# Patient Record
Sex: Female | Born: 1965 | Race: Black or African American | Hispanic: No | Marital: Married | Smoking: Never smoker
Health system: Southern US, Community
[De-identification: ages and names within clinical notes are randomized; demographics above are authoritative.]

## PROBLEM LIST (undated history)

## (undated) DIAGNOSIS — J302 Other seasonal allergic rhinitis: Secondary | ICD-10-CM

## (undated) DIAGNOSIS — T7840XA Allergy, unspecified, initial encounter: Secondary | ICD-10-CM

## (undated) DIAGNOSIS — D649 Anemia, unspecified: Secondary | ICD-10-CM

## (undated) DIAGNOSIS — Z87898 Personal history of other specified conditions: Secondary | ICD-10-CM

## (undated) HISTORY — DX: Personal history of other specified conditions: Z87.898

## (undated) HISTORY — DX: Allergy, unspecified, initial encounter: T78.40XA

---

## 1998-12-31 ENCOUNTER — Other Ambulatory Visit: Admission: RE | Admit: 1998-12-31 | Discharge: 1998-12-31 | Payer: Self-pay | Admitting: Gynecology

## 1999-12-21 ENCOUNTER — Other Ambulatory Visit: Admission: RE | Admit: 1999-12-21 | Discharge: 1999-12-21 | Payer: Self-pay | Admitting: Gynecology

## 2000-12-21 ENCOUNTER — Other Ambulatory Visit: Admission: RE | Admit: 2000-12-21 | Discharge: 2000-12-21 | Payer: Self-pay | Admitting: Gynecology

## 2001-12-24 ENCOUNTER — Other Ambulatory Visit: Admission: RE | Admit: 2001-12-24 | Discharge: 2001-12-24 | Payer: Self-pay | Admitting: Gynecology

## 2003-02-25 ENCOUNTER — Other Ambulatory Visit: Admission: RE | Admit: 2003-02-25 | Discharge: 2003-02-25 | Payer: Self-pay | Admitting: Gynecology

## 2004-03-22 ENCOUNTER — Other Ambulatory Visit: Admission: RE | Admit: 2004-03-22 | Discharge: 2004-03-22 | Payer: Self-pay | Admitting: Gynecology

## 2005-03-28 ENCOUNTER — Other Ambulatory Visit: Admission: RE | Admit: 2005-03-28 | Discharge: 2005-03-28 | Payer: Self-pay | Admitting: Gynecology

## 2008-01-10 ENCOUNTER — Encounter: Admission: RE | Admit: 2008-01-10 | Discharge: 2008-01-10 | Payer: Self-pay | Admitting: Endocrinology

## 2008-01-10 ENCOUNTER — Other Ambulatory Visit: Admission: RE | Admit: 2008-01-10 | Discharge: 2008-01-10 | Payer: Self-pay | Admitting: Diagnostic Radiology

## 2008-01-10 ENCOUNTER — Encounter (INDEPENDENT_AMBULATORY_CARE_PROVIDER_SITE_OTHER): Payer: Self-pay | Admitting: Diagnostic Radiology

## 2011-01-19 ENCOUNTER — Other Ambulatory Visit: Payer: Self-pay | Admitting: Gynecology

## 2011-01-19 DIAGNOSIS — R928 Other abnormal and inconclusive findings on diagnostic imaging of breast: Secondary | ICD-10-CM

## 2011-01-21 ENCOUNTER — Ambulatory Visit
Admission: RE | Admit: 2011-01-21 | Discharge: 2011-01-21 | Disposition: A | Discharge status: Home / Self Care | Payer: BC Managed Care – PPO | Source: Ambulatory Visit | Attending: Gynecology | Admitting: Gynecology

## 2011-01-21 ENCOUNTER — Other Ambulatory Visit: Payer: Self-pay | Admitting: Gynecology

## 2011-01-21 DIAGNOSIS — R928 Other abnormal and inconclusive findings on diagnostic imaging of breast: Secondary | ICD-10-CM

## 2011-04-06 ENCOUNTER — Encounter: Payer: BC Managed Care – PPO | Admitting: Oncology

## 2011-04-12 ENCOUNTER — Encounter (HOSPITAL_BASED_OUTPATIENT_CLINIC_OR_DEPARTMENT_OTHER): Payer: BC Managed Care – PPO | Admitting: Oncology

## 2011-04-12 ENCOUNTER — Other Ambulatory Visit: Payer: Self-pay | Admitting: Oncology

## 2011-04-12 DIAGNOSIS — D473 Essential (hemorrhagic) thrombocythemia: Secondary | ICD-10-CM

## 2011-04-12 DIAGNOSIS — D696 Thrombocytopenia, unspecified: Secondary | ICD-10-CM

## 2011-04-12 LAB — COMPREHENSIVE METABOLIC PANEL
ALT: 9 U/L (ref 0–35)
AST: 14 U/L (ref 0–37)
Albumin: 4.1 g/dL (ref 3.5–5.2)
Alkaline Phosphatase: 53 U/L (ref 39–117)
Calcium: 9.5 mg/dL (ref 8.4–10.5)
Chloride: 103 mEq/L (ref 96–112)
Potassium: 3.9 mEq/L (ref 3.5–5.3)
Sodium: 139 mEq/L (ref 135–145)
Total Protein: 6.9 g/dL (ref 6.0–8.3)

## 2011-04-12 LAB — MORPHOLOGY: PLT EST: INCREASED

## 2011-04-12 LAB — CBC WITH DIFFERENTIAL/PLATELET
BASO%: 0.5 % (ref 0.0–2.0)
Basophils Absolute: 0 10*3/uL (ref 0.0–0.1)
HCT: 33.9 % — ABNORMAL LOW (ref 34.8–46.6)
HGB: 11.1 g/dL — ABNORMAL LOW (ref 11.6–15.9)
LYMPH%: 18.4 % (ref 14.0–49.7)
MCH: 29.9 pg (ref 25.1–34.0)
MCHC: 32.6 g/dL (ref 31.5–36.0)
MONO#: 0.5 10*3/uL (ref 0.1–0.9)
NEUT%: 71.4 % (ref 38.4–76.8)
Platelets: 456 10*3/uL — ABNORMAL HIGH (ref 145–400)
WBC: 5.4 10*3/uL (ref 3.9–10.3)

## 2011-04-12 LAB — LACTATE DEHYDROGENASE: LDH: 138 U/L (ref 94–250)

## 2011-04-12 LAB — FERRITIN: Ferritin: 32 ng/mL (ref 10–291)

## 2011-04-12 LAB — SEDIMENTATION RATE: Sed Rate: 25 mm/hr — ABNORMAL HIGH (ref 0–22)

## 2011-04-12 LAB — C-REACTIVE PROTEIN: CRP: 0.7 mg/dL — ABNORMAL HIGH (ref <0.6)

## 2011-04-12 LAB — CANCER ANTIGEN 27.29: CA 27.29: 5 U/mL (ref 0–39)

## 2011-07-27 ENCOUNTER — Encounter (HOSPITAL_BASED_OUTPATIENT_CLINIC_OR_DEPARTMENT_OTHER): Payer: BC Managed Care – PPO | Admitting: Oncology

## 2011-08-02 ENCOUNTER — Other Ambulatory Visit: Payer: Self-pay | Admitting: Oncology

## 2011-08-02 ENCOUNTER — Encounter (HOSPITAL_BASED_OUTPATIENT_CLINIC_OR_DEPARTMENT_OTHER): Payer: BC Managed Care – PPO | Admitting: Oncology

## 2011-08-02 DIAGNOSIS — D473 Essential (hemorrhagic) thrombocythemia: Secondary | ICD-10-CM

## 2011-08-02 LAB — CBC & DIFF AND RETIC
Basophils Absolute: 0 10*3/uL (ref 0.0–0.1)
EOS%: 1.2 % (ref 0.0–7.0)
HGB: 11.8 g/dL (ref 11.6–15.9)
MCH: 28.9 pg (ref 25.1–34.0)
MCV: 90 fL (ref 79.5–101.0)
MONO%: 10.9 % (ref 0.0–14.0)
RDW: 13.4 % (ref 11.2–14.5)
Retic Ct Abs: 87.12 10*3/uL (ref 33.70–90.70)

## 2011-08-02 LAB — MORPHOLOGY: PLT EST: INCREASED

## 2011-11-23 ENCOUNTER — Other Ambulatory Visit: Payer: Self-pay | Admitting: Obstetrics and Gynecology

## 2011-11-25 ENCOUNTER — Encounter (HOSPITAL_COMMUNITY): Payer: Self-pay | Admitting: Pharmacist

## 2011-11-29 ENCOUNTER — Encounter (HOSPITAL_COMMUNITY)
Admission: RE | Admit: 2011-11-29 | Discharge: 2011-11-29 | Disposition: A | Discharge status: Home / Self Care | Payer: BC Managed Care – PPO | Source: Ambulatory Visit | Attending: Obstetrics and Gynecology | Admitting: Obstetrics and Gynecology

## 2011-11-29 ENCOUNTER — Encounter (HOSPITAL_COMMUNITY): Payer: Self-pay

## 2011-11-29 HISTORY — DX: Anemia, unspecified: D64.9

## 2011-11-29 HISTORY — DX: Other seasonal allergic rhinitis: J30.2

## 2011-11-29 LAB — CBC
HCT: 37.3 % (ref 36.0–46.0)
Hemoglobin: 11.8 g/dL — ABNORMAL LOW (ref 12.0–15.0)
MCHC: 31.6 g/dL (ref 30.0–36.0)
MCV: 92.1 fL (ref 78.0–100.0)
RDW: 13.8 % (ref 11.5–15.5)
WBC: 6.4 10*3/uL (ref 4.0–10.5)

## 2011-11-29 NOTE — Patient Instructions (Addendum)
   Your procedure is scheduled [id number] Feb 26th  Enter through the Main Entrance of [facility] at:10:30am Pick up the phone at the desk and dial [phone] and inform us of your arrival.  Please call this number if you have any problems the morning of surgery: [phone]  Remember: Do not eat food after midnight: Monday Do not drink clear liquids after:Tuesday at 9am Take these medicines the morning of surgery with a SIP OF WATER:none Do not wear jewelry, make-up, or FINGER nail polish Do not wear lotions, powders, perfumes or deodorant. Do not shave 48 hours prior to surgery. Do not bring valuables to the hospital.  Leave suitcase in the car. After Surgery it may be brought to your room. For patients being admitted to the hospital, checkout time is 11:00am the day of discharge.  Patients discharged on the day of surgery will not be allowed to drive home.     Remember to use your hibiclens as instructed.Please shower with 1/2 bottle the evening before your surgery and the other 1/2 bottle the morning of surgery.

## 2011-12-06 ENCOUNTER — Encounter (HOSPITAL_COMMUNITY)
Admission: RE | Disposition: A | Discharge status: Home Health | Payer: Self-pay | Source: Ambulatory Visit | Attending: Obstetrics and Gynecology

## 2011-12-06 ENCOUNTER — Encounter (HOSPITAL_COMMUNITY): Payer: Self-pay | Admitting: Anesthesiology

## 2011-12-06 ENCOUNTER — Ambulatory Visit (HOSPITAL_COMMUNITY)
Admission: RE | Admit: 2011-12-06 | Discharge: 2011-12-06 | Disposition: A | Discharge status: Home Health | Payer: BC Managed Care – PPO | Source: Ambulatory Visit | Attending: Obstetrics and Gynecology | Admitting: Obstetrics and Gynecology

## 2011-12-06 ENCOUNTER — Ambulatory Visit (HOSPITAL_COMMUNITY): Payer: BC Managed Care – PPO | Admitting: Anesthesiology

## 2011-12-06 DIAGNOSIS — D279 Benign neoplasm of unspecified ovary: Secondary | ICD-10-CM | POA: Insufficient documentation

## 2011-12-06 DIAGNOSIS — D369 Benign neoplasm, unspecified site: Secondary | ICD-10-CM

## 2011-12-06 DIAGNOSIS — N803 Endometriosis of pelvic peritoneum, unspecified: Secondary | ICD-10-CM | POA: Insufficient documentation

## 2011-12-06 DIAGNOSIS — N831 Corpus luteum cyst of ovary, unspecified side: Secondary | ICD-10-CM | POA: Insufficient documentation

## 2011-12-06 DIAGNOSIS — Z01818 Encounter for other preprocedural examination: Secondary | ICD-10-CM | POA: Insufficient documentation

## 2011-12-06 DIAGNOSIS — Z01812 Encounter for preprocedural laboratory examination: Secondary | ICD-10-CM | POA: Insufficient documentation

## 2011-12-06 HISTORY — PX: LAPAROSCOPY: SHX197

## 2011-12-06 HISTORY — PX: OVARIAN CYST REMOVAL: SHX89

## 2011-12-06 SURGERY — LAPAROSCOPY OPERATIVE
Anesthesia: General | Site: Abdomen | Wound class: Clean Contaminated

## 2011-12-06 MED ORDER — DEXAMETHASONE SODIUM PHOSPHATE 4 MG/ML IJ SOLN
INTRAMUSCULAR | Status: DC | PRN
  Administered 2011-12-06: 10 mg via INTRAVENOUS

## 2011-12-06 MED ORDER — NEOSTIGMINE METHYLSULFATE 1 MG/ML IJ SOLN
INTRAMUSCULAR | Status: DC | PRN
  Administered 2011-12-06: 3 mg via INTRAVENOUS

## 2011-12-06 MED ORDER — CEFAZOLIN SODIUM 1-5 GM-% IV SOLN
INTRAVENOUS | Status: AC
  Filled 2011-12-06: qty 50

## 2011-12-06 MED ORDER — PROMETHAZINE HCL 25 MG RE SUPP
25.0000 mg | RECTAL | Status: DC | PRN
  Administered 2011-12-06: 25 mg via RECTAL

## 2011-12-06 MED ORDER — METOCLOPRAMIDE HCL 5 MG/ML IJ SOLN
INTRAMUSCULAR | Status: AC
  Administered 2011-12-06: 10 mg via INTRAVENOUS
  Filled 2011-12-06: qty 2

## 2011-12-06 MED ORDER — LACTATED RINGERS IV SOLN
INTRAVENOUS | Status: DC
  Administered 2011-12-06: 11:00:00 via INTRAVENOUS

## 2011-12-06 MED ORDER — FENTANYL CITRATE 0.05 MG/ML IJ SOLN
INTRAMUSCULAR | Status: AC
  Administered 2011-12-06: 50 ug via INTRAVENOUS
  Filled 2011-12-06: qty 2

## 2011-12-06 MED ORDER — BUPIVACAINE HCL (PF) 0.25 % IJ SOLN
INTRAMUSCULAR | Status: DC | PRN
  Administered 2011-12-06: 5 mL

## 2011-12-06 MED ORDER — PROPOFOL 10 MG/ML IV EMUL
INTRAVENOUS | Status: DC | PRN
  Administered 2011-12-06: 180 mg via INTRAVENOUS

## 2011-12-06 MED ORDER — LACTATED RINGERS IR SOLN
Status: DC | PRN
  Administered 2011-12-06: 3000 mL

## 2011-12-06 MED ORDER — METOCLOPRAMIDE HCL 5 MG/ML IJ SOLN
10.0000 mg | INTRAMUSCULAR | Status: AC
  Administered 2011-12-06: 10 mg via INTRAVENOUS

## 2011-12-06 MED ORDER — FENTANYL CITRATE 0.05 MG/ML IJ SOLN
INTRAMUSCULAR | Status: DC | PRN
  Administered 2011-12-06 (×2): 50 ug via INTRAVENOUS
  Administered 2011-12-06: 150 ug via INTRAVENOUS

## 2011-12-06 MED ORDER — BUPIVACAINE HCL (PF) 0.25 % IJ SOLN
INTRAMUSCULAR | Status: AC
  Filled 2011-12-06: qty 30

## 2011-12-06 MED ORDER — ONDANSETRON HCL 4 MG/2ML IJ SOLN
INTRAMUSCULAR | Status: DC | PRN
  Administered 2011-12-06: 4 mg via INTRAVENOUS

## 2011-12-06 MED ORDER — GLYCOPYRROLATE 0.2 MG/ML IJ SOLN
INTRAMUSCULAR | Status: DC | PRN
  Administered 2011-12-06: .4 mg via INTRAVENOUS

## 2011-12-06 MED ORDER — ROCURONIUM BROMIDE 100 MG/10ML IV SOLN
INTRAVENOUS | Status: DC | PRN
  Administered 2011-12-06: 40 mg via INTRAVENOUS

## 2011-12-06 MED ORDER — INDIGOTINDISULFONATE SODIUM 8 MG/ML IJ SOLN
INTRAMUSCULAR | Status: AC
  Filled 2011-12-06: qty 5

## 2011-12-06 MED ORDER — FENTANYL CITRATE 0.05 MG/ML IJ SOLN
25.0000 ug | INTRAMUSCULAR | Status: DC | PRN
  Administered 2011-12-06: 50 ug via INTRAVENOUS

## 2011-12-06 MED ORDER — FENTANYL CITRATE 0.05 MG/ML IJ SOLN
INTRAMUSCULAR | Status: AC
  Filled 2011-12-06: qty 5

## 2011-12-06 MED ORDER — MIDAZOLAM HCL 5 MG/5ML IJ SOLN
INTRAMUSCULAR | Status: DC | PRN
  Administered 2011-12-06: 2 mg via INTRAVENOUS

## 2011-12-06 MED ORDER — KETOROLAC TROMETHAMINE 30 MG/ML IJ SOLN
INTRAMUSCULAR | Status: DC | PRN
  Administered 2011-12-06: 30 mg via INTRAVENOUS

## 2011-12-06 MED ORDER — PROMETHAZINE HCL 25 MG RE SUPP
RECTAL | Status: AC
  Administered 2011-12-06: 25 mg via RECTAL
  Filled 2011-12-06: qty 1

## 2011-12-06 MED ORDER — VASOPRESSIN 20 UNIT/ML IJ SOLN
INTRAMUSCULAR | Status: AC
  Filled 2011-12-06: qty 1

## 2011-12-06 MED ORDER — CEFAZOLIN SODIUM 1-5 GM-% IV SOLN
1.0000 g | INTRAVENOUS | Status: AC
  Administered 2011-12-06: 1 g via INTRAVENOUS

## 2011-12-06 MED ORDER — MIDAZOLAM HCL 2 MG/2ML IJ SOLN
INTRAMUSCULAR | Status: AC
  Filled 2011-12-06: qty 2

## 2011-12-06 MED ORDER — LIDOCAINE HCL (CARDIAC) 20 MG/ML IV SOLN
INTRAVENOUS | Status: DC | PRN
  Administered 2011-12-06: 50 mg via INTRAVENOUS

## 2011-12-06 MED ORDER — OXYCODONE-ACETAMINOPHEN 5-325 MG PO TABS: 1.0000 | ORAL_TABLET | ORAL | Status: AC | PRN

## 2011-12-06 SURGICAL SUPPLY — 32 items
ADH SKN CLS APL DERMABOND .7 (GAUZE/BANDAGES/DRESSINGS) ×2
ADH SKN CLS LQ APL DERMABOND (GAUZE/BANDAGES/DRESSINGS) ×2
BAG SPEC RTRVL LRG 6X4 10 (ENDOMECHANICALS) ×4
CABLE HIGH FREQUENCY MONO STRZ (ELECTRODE) ×1 IMPLANT
CATH ROBINSON RED A/P 16FR (CATHETERS) ×3 IMPLANT
CLOTH BEACON ORANGE TIMEOUT ST (SAFETY) ×3 IMPLANT
DERMABOND ADHESIVE PROPEN (GAUZE/BANDAGES/DRESSINGS) ×1
DERMABOND ADVANCED (GAUZE/BANDAGES/DRESSINGS) ×1
DERMABOND ADVANCED .7 DNX12 (GAUZE/BANDAGES/DRESSINGS) ×2 IMPLANT
DERMABOND ADVANCED .7 DNX6 (GAUZE/BANDAGES/DRESSINGS) IMPLANT
EVACUATOR SMOKE 8.L (FILTER) IMPLANT
FORCEPS CUTTING 33CM 5MM (CUTTING FORCEPS) IMPLANT
GLOVE ECLIPSE 7.0 STRL STRAW (GLOVE) ×6 IMPLANT
GOWN PREVENTION PLUS LG XLONG (DISPOSABLE) ×3 IMPLANT
GOWN PREVENTION PLUS XLARGE (GOWN DISPOSABLE) ×3 IMPLANT
NS IRRIG 1000ML POUR BTL (IV SOLUTION) ×3 IMPLANT
PACK LAPAROSCOPY BASIN (CUSTOM PROCEDURE TRAY) ×3 IMPLANT
POUCH SPECIMEN RETRIEVAL 10MM (ENDOMECHANICALS) ×2 IMPLANT
PROTECTOR NERVE ULNAR (MISCELLANEOUS) ×3 IMPLANT
SCISSORS LAP 5X35 DISP (ENDOMECHANICALS) ×1 IMPLANT
SEALER TISSUE G2 CVD JAW 45CM (ENDOMECHANICALS) IMPLANT
SET IRRIG TUBING LAPAROSCOPIC (IRRIGATION / IRRIGATOR) ×1 IMPLANT
SLEEVE Z-THREAD 5X100MM (TROCAR) ×1 IMPLANT
SUT VICRYL 0 UR6 27IN ABS (SUTURE) ×1 IMPLANT
SUT VICRYL RAPIDE 3 0 (SUTURE) ×3 IMPLANT
SYR 30ML LL (SYRINGE) ×1 IMPLANT
TOWEL OR 17X24 6PK STRL BLUE (TOWEL DISPOSABLE) ×6 IMPLANT
TROCAR BALLN 12MMX100 BLUNT (TROCAR) ×3 IMPLANT
TROCAR Z-THREAD BLADED 5X100MM (TROCAR) ×5 IMPLANT
TROCAR Z-THREAD FIOS 5X100MM (TROCAR) ×1 IMPLANT
WARMER LAPAROSCOPE (MISCELLANEOUS) ×3 IMPLANT
WATER STERILE IRR 1000ML POUR (IV SOLUTION) ×3 IMPLANT

## 2011-12-06 NOTE — Discharge Instructions (Signed)

## 2011-12-06 NOTE — Transfer of Care (Signed)
Immediate Anesthesia Transfer of Care Note  Patient: [name]  Procedure(s) Performed: Procedure(s) (LRB): LAPAROSCOPY OPERATIVE ([name]) OVARIAN CYSTECTOMY (Bilateral)  Patient Location: PACU  Anesthesia Type: MAC  Level of Consciousness: awake, alert  and oriented  Airway & Oxygen Therapy: Patient Spontanous Breathing  Post-op Assessment: Report given to PACU RN and Post -op Vital signs reviewed and stable  Post vital signs: Reviewed and stable  Complications: No apparent anesthesia complications

## 2011-12-06 NOTE — Transfer of Care (Deleted)
Immediate Anesthesia Transfer of Care Note  Patient: [name]  Procedure(s) Performed: Procedure(s) (LRB): LAPAROSCOPY OPERATIVE ([name]) OVARIAN CYSTECTOMY (Bilateral)  Patient Location: PACU  Anesthesia Type: General  Level of Consciousness: awake, alert  and oriented  Airway & Oxygen Therapy: Patient Spontanous Breathing and Patient connected to nasal cannula oxygen  Post-op Assessment: Report given to PACU RN and Post -op Vital signs reviewed and stable  Post vital signs: Reviewed and stable  Complications: No apparent anesthesia complications

## 2011-12-06 NOTE — Anesthesia Procedure Notes (Signed)
Procedure Name: Intubation Date/Time: 12/06/2011 12:06 PM Performed by: Madison Hickman Pre-anesthesia Checklist: Patient being monitored, Suction available, Emergency Drugs available, Patient identified and Timeout performed Patient Re-evaluated:Patient Re-evaluated prior to inductionOxygen Delivery Method: Circle system utilized Preoxygenation: Pre-oxygenation with 100% oxygen Intubation Type: IV induction Ventilation: Mask ventilation without difficulty Grade View: Grade III Tube type: Oral Tube size: 7.0 mm Number of attempts: 2 Airway Equipment and Method: Stylet and Video-laryngoscopy Placement Confirmation: ETT inserted through vocal cords under direct vision,  positive ETCO2,  CO2 detector and breath sounds checked- equal and bilateral Secured at: 21 cm Tube secured with: Tape Dental Injury: Teeth and Oropharynx as per pre-operative assessment  Difficulty Due To: Difficult Airway- due to anterior larynx

## 2011-12-06 NOTE — Anesthesia Postprocedure Evaluation (Signed)
Anesthesia Post Note  Patient: [name]  Procedure(s) Performed: Procedure(s) (LRB): LAPAROSCOPY OPERATIVE ([name]) OVARIAN CYSTECTOMY (Bilateral)  Anesthesia type: General  Patient location: PACU  Post pain: Pain level controlled  Post assessment: Post-op Vital signs reviewed  Last Vitals:  Filed Vitals:   12/06/11 1500  BP: 116/71  Pulse: 95  Temp:   Resp: 16    Post vital signs: Reviewed  Level of consciousness: sedated  Complications: No apparent anesthesia complications

## 2011-12-06 NOTE — Anesthesia Preprocedure Evaluation (Signed)
Anesthesia Evaluation  Patient identified by MRN, date of birth, ID band Patient awake    Reviewed: Allergy & Precautions, H&P , Patient's Chart, lab work & pertinent test results, reviewed documented beta blocker date and time   Airway Mallampati: II TM Distance: >3 FB Neck ROM: full    Dental No notable dental hx.    Pulmonary  clear to auscultation  Pulmonary exam normal       Cardiovascular regular Normal    Neuro/Psych    GI/Hepatic   Endo/Other    Renal/GU      Musculoskeletal   Abdominal   Peds  Hematology   Anesthesia Other Findings   Reproductive/Obstetrics                           Anesthesia Physical Anesthesia Plan  ASA: II  Anesthesia Plan: General   Post-op Pain Management:    Induction: Intravenous  Airway Management Planned: Oral ETT  Additional Equipment:   Intra-op Plan:   Post-operative Plan:   Informed Consent: I have reviewed the patients History and Physical, chart, labs and discussed the procedure including the risks, benefits and alternatives for the proposed anesthesia with the patient or authorized representative who has indicated his/her understanding and acceptance.   Dental Advisory Given and Dental advisory given  Plan Discussed with: CRNA and Surgeon  Anesthesia Plan Comments: (  Discussed  general anesthesia, including possible nausea, instrumentation of airway, sore throat,pulmonary aspiration, etc. I asked if the were any outstanding questions, or  concerns before we proceeded. )        Anesthesia Quick Evaluation  

## 2011-12-06 NOTE — H&P (Signed)
Pt is a 46 year old black female who presents to the OR for diagnostic scope and bilateral ovarian cystectomy. Pt was found to have a dermoid cyst on ultrasound. It measured 2.5cm. It does not cause significant pain but the patient wants it out. Pt also has a small simple cyst on the opposite ovary. PE : VSSAF          HEENT- wnl          Abd- soft, NABS, non tender.          Pelvic- wnl IMP/ Dermoid cyst PLAN/ diagnostic scope

## 2011-12-07 ENCOUNTER — Encounter (HOSPITAL_COMMUNITY): Payer: Self-pay | Admitting: Obstetrics and Gynecology

## 2011-12-07 NOTE — Op Note (Signed)
[name], [name] NO.:  [id number]  MEDICAL RECORD NO.:  [id number]  LOCATION:  [facility]                          FACILITY:  [facility]  PHYSICIAN:  Malva Limes, M.D.    DATE OF BIRTH:  May 20, 1966  DATE OF PROCEDURE:  12/06/2011 DATE OF DISCHARGE:  12/06/2011                              OPERATIVE REPORT   PREOPERATIVE DIAGNOSES: 1. Dermoid ovarian cyst. 2. Simple ovarian cyst.  POSTOPERATIVE DIAGNOSES: 1. Left dermoid cyst. 2. Right corpus luteum cyst. 3. Minimal endometriosis.  PRINCIPAL PROCEDURES: 1. Diagnostic laparoscopy. 2. Left ovarian cystectomy. 3. Right ovarian cystectomy. 4. Cauterization of endometriosis.  SURGEON:  Malva Limes, M.D.  ASSISTANT:  Diane Redden, MD  ANESTHESIA:  General endotracheal.  ANTIBIOTICS:  Ancef 1 g.  DRAINS:  Foley at bedside drainage.  ESTIMATED BLOOD LOSS:  Minimal.  SPECIMENS:  Left dermoid cyst sent to Pathology.  COMPLICATIONS:  None.  ESTIMATED BLOOD LOSS:  Minimal.  DESCRIPTION OF PROCEDURE:  The patient was taken to the operating room Where a general anesthetic was administered.  Once this was accomplished, she was placed in the dorsal lithotomy position.  She was prepped and draped in the usual fashion for this procedure.  Her bladder was drained with a red rubber catheter.  Hulka tenaculum was applied to the anterior cervical lip.  Her umbilicus was then injected with 0.25% Marcaine.  Vertical skin incision was made.  The fascia was grasped and entered with the Mayo scissors.  Parietal peritoneum was entered with blunt dissection.  A 0 Vicryl suture was placed in a pursestring fashion.  The Hasson cannula was placed in the abdominal cavity.   3 L of carbon dioxide was insufflated.  The patient was placed in Trendelenburg.  The patient had 5-mm ports placed in the right and left lower quadrants under direct visualization. Fallopian tubes appeared to be normal bilaterally.  The uterus  was without lesions or fibroids.  The patient had minimal amount of endometriosis on the left uterosacral ligament, and also the left posterior cul-de-sac.  The ovaries were mobile.  The larger cyst was on the left that had been previously reported on ultrasound as a simple cyst.  The serosa of the ovary was then cauterized and opened with the scissors.  During dissection of the cyst, thick mucoid fluid drained, consistent with a dermoid cyst.  Once the cyst was freed, it was placed in the posterior cul-de-sac.  It was obvious that this was a dermoid cyst. Attention was then directed to the right ovary.  Right ovary was grasped.  The serosa over the cyst cauterized and opened.  On opening this, it was obvious that the patient had corpus luteum cyst on this side.  Following this, cauterization of endometriosis on the left pelvic wall was accomplished. Following this, the 10-mm scope was removed from the umbilicus and Endobag placed through this port.  A 5-mm camera was placed through the right lower quadrant.  The dermoid cyst was then placed in the Endobag and removed through the 10-mm port.  Following this, the pelvis was copiously irrigated.  Hemostasis was checked and felt to be adequate. This completed the procedure.  Instruments were removed. Pneumoperitoneum  released.  The fascia was closed with 0 Vicryl suture. The skin with 3-0 Vicryl suture and Dermabond.  The patient was awoken and taken to the recovery room in stable condition.  Instrument, lap count was correct x2.          ______________________________ Malva Limes, M.D.     MA/MEDQ  D:  12/06/2011  T:  12/07/2011  Job:  [id number]

## 2012-02-13 ENCOUNTER — Other Ambulatory Visit: Payer: BC Managed Care – PPO | Admitting: Lab

## 2012-04-26 ENCOUNTER — Encounter (HOSPITAL_COMMUNITY): Payer: Self-pay | Admitting: *Deleted

## 2012-04-26 ENCOUNTER — Ambulatory Visit (INDEPENDENT_AMBULATORY_CARE_PROVIDER_SITE_OTHER): Payer: BC Managed Care – PPO | Admitting: Emergency Medicine

## 2012-04-26 ENCOUNTER — Inpatient Hospital Stay (HOSPITAL_COMMUNITY)
Admission: EM | Admit: 2012-04-26 | Discharge: 2012-04-27 | DRG: 301 | Disposition: A | Discharge status: Home / Self Care | Payer: BC Managed Care – PPO | Attending: Internal Medicine | Admitting: Internal Medicine

## 2012-04-26 VITALS — BP 108/70 | HR 108 | Temp 100.9°F | Resp 12 | Ht 64.25 in | Wt 161.8 lb

## 2012-04-26 DIAGNOSIS — L03221 Cellulitis of neck: Secondary | ICD-10-CM

## 2012-04-26 DIAGNOSIS — R000 Tachycardia, unspecified: Secondary | ICD-10-CM | POA: Diagnosis present

## 2012-04-26 DIAGNOSIS — R1319 Other dysphagia: Secondary | ICD-10-CM | POA: Diagnosis present

## 2012-04-26 DIAGNOSIS — D649 Anemia, unspecified: Secondary | ICD-10-CM | POA: Diagnosis present

## 2012-04-26 DIAGNOSIS — L0211 Cutaneous abscess of neck: Secondary | ICD-10-CM

## 2012-04-26 DIAGNOSIS — J309 Allergic rhinitis, unspecified: Secondary | ICD-10-CM | POA: Diagnosis present

## 2012-04-26 DIAGNOSIS — E041 Nontoxic single thyroid nodule: Principal | ICD-10-CM | POA: Diagnosis present

## 2012-04-26 DIAGNOSIS — R221 Localized swelling, mass and lump, neck: Secondary | ICD-10-CM

## 2012-04-26 LAB — COMPREHENSIVE METABOLIC PANEL
AST: 15 U/L (ref 0–37)
BUN: 8 mg/dL (ref 6–23)
CO2: 25 mEq/L (ref 19–32)
Calcium: 10 mg/dL (ref 8.4–10.5)
Chloride: 99 mEq/L (ref 96–112)
Creatinine, Ser: 0.66 mg/dL (ref 0.50–1.10)
GFR calc non Af Amer: >90 mL/min (ref >90)
Total Bilirubin: 0.3 mg/dL (ref 0.3–1.2)

## 2012-04-26 LAB — CBC WITH DIFFERENTIAL/PLATELET
Basophils Absolute: 0 10*3/uL (ref 0.0–0.1)
Basophils Relative: 0 % (ref 0–1)
Eosinophils Relative: 0 % (ref 0–5)
HCT: 35.1 % — ABNORMAL LOW (ref 36.0–46.0)
Hemoglobin: 11.6 g/dL — ABNORMAL LOW (ref 12.0–15.0)
MCHC: 33 g/dL (ref 30.0–36.0)
MCV: 89.5 fL (ref 78.0–100.0)
Monocytes Absolute: 1.1 10*3/uL — ABNORMAL HIGH (ref 0.1–1.0)
Monocytes Relative: 11 % (ref 3–12)
RDW: 13.5 % (ref 11.5–15.5)

## 2012-04-26 LAB — URINALYSIS, ROUTINE W REFLEX MICROSCOPIC
Bilirubin Urine: NEGATIVE
Glucose, UA: NEGATIVE mg/dL
Ketones, ur: 40 mg/dL — AB
Protein, ur: NEGATIVE mg/dL
Urobilinogen, UA: 0.2 mg/dL (ref 0.0–1.0)

## 2012-04-26 LAB — URINE MICROSCOPIC-ADD ON

## 2012-04-26 LAB — RAPID STREP SCREEN (MED CTR MEBANE ONLY): Streptococcus, Group A Screen (Direct): NEGATIVE

## 2012-04-26 MED ORDER — ONDANSETRON HCL 4 MG/2ML IJ SOLN: 4.0000 mg | Freq: Once | INTRAMUSCULAR | Status: DC

## 2012-04-26 MED ORDER — SODIUM CHLORIDE 0.9 % IV BOLUS (SEPSIS)
1000.0000 mL | Freq: Once | INTRAVENOUS | Status: AC
  Administered 2012-04-26: 1000 mL via INTRAVENOUS

## 2012-04-26 MED ORDER — CEFTRIAXONE SODIUM 1 G IJ SOLR
1.0000 g | Freq: Once | INTRAMUSCULAR | Status: AC
  Administered 2012-04-26: 1 g via INTRAMUSCULAR

## 2012-04-26 MED ORDER — ACETAMINOPHEN 325 MG PO TABS
650.0000 mg | ORAL_TABLET | Freq: Once | ORAL | Status: AC
  Administered 2012-04-26: 650 mg via ORAL
  Filled 2012-04-26: qty 2

## 2012-04-26 MED ORDER — FENTANYL CITRATE 0.05 MG/ML IJ SOLN: 50.0000 ug | Freq: Once | INTRAMUSCULAR | Status: DC

## 2012-04-26 NOTE — ED Notes (Addendum)
Pt c/o swelling in lymph nodes on left side of neck. Pt reports it has been very sore and when she was at urgent care they suggested she come over to get a CT scan here. Pt reports her neck was stiff on Tuesday and the swelling started Wednesday and has progressively gotten worse. Pt felt concerned because the swelling has not gone down. Pt has tried ice and ibuprofen at home. Pt denies injury and fall. Pt denies SOB. Pt reports it is painful to swallow but denies any feeling of obstruction.

## 2012-04-26 NOTE — ED Notes (Signed)
The pt has swelling in her lt neck with some sorethroat since yesterday.  She was seen at [place] and sent here  ?? For a c-t she has an earache also.  No strep screen done nut she was given amn antibiotic shot there

## 2012-04-26 NOTE — Progress Notes (Signed)
  Subjective:    Patient ID: [name], female    DOB: [birth year], 46 y.o.   MRN: [id number]  Otalgia  Associated symptoms include coughing and neck pain. Pertinent negatives include no abdominal pain, diarrhea, ear discharge, headaches, hearing loss, rhinorrhea or vomiting.  Sore Throat  This is a new problem. The current episode started today. The problem has been gradually worsening. The pain is worse on the left side. The maximum temperature recorded prior to her arrival was 101 - 101.9 F. The fever has been present for less than 1 day. The pain is moderate. Associated symptoms include coughing, ear pain, neck pain and trouble swallowing. Pertinent negatives include no abdominal pain, congestion, diarrhea, drooling, ear discharge, headaches, hoarse voice, plugged ear sensation, shortness of breath, stridor, swollen glands or vomiting. She has tried nothing for the symptoms.      Review of Systems  Constitutional: Positive for fever.  HENT: Positive for ear pain, trouble swallowing, neck pain and neck stiffness. Negative for hearing loss, nosebleeds, congestion, hoarse voice, rhinorrhea, sneezing, drooling, postnasal drip, tinnitus and ear discharge.   Eyes: Negative.  Negative for discharge.  Respiratory: Positive for cough. Negative for choking, chest tightness, shortness of breath, wheezing and stridor.   Cardiovascular: Negative.   Gastrointestinal: Negative.  Negative for vomiting, abdominal pain and diarrhea.  Genitourinary: Negative.   Neurological: Negative for headaches.       Objective:   Physical Exam  Constitutional: She is oriented to person, place, and time. She appears well-developed and well-nourished.  HENT:  Head: Normocephalic and atraumatic.  Right Ear: External ear normal.  Eyes: Conjunctivae are normal. Pupils are equal, round, and reactive to light.  Neck: Normal range of motion. No tracheal deviation present. Mass present.  Cardiovascular: Normal rate  and regular rhythm.   Pulmonary/Chest: Effort normal and breath sounds normal.  Abdominal: Soft. Bowel sounds are normal.  Musculoskeletal: Normal range of motion.  Lymphadenopathy:    She has cervical adenopathy.  Neurological: She is alert and oriented to person, place, and time.  Skin: Skin is warm and dry.          Assessment & Plan:  Atypical neck mass associated with dysphagia, fever, and stiff neck.  Pain with cough.   To ER after dose of rocephin for CT

## 2012-04-26 NOTE — ED Provider Notes (Signed)
History     CSN: [id number]  Arrival date & time 04/26/12  4098   First MD Initiated Contact with Patient 04/26/12 2317      Chief Complaint  Patient presents with  . sent for a c-t     (Consider location/radiation/quality/duration/timing/severity/associated sxs/prior treatment) HPI History per patient. Left-sided neck discomfort and swelling. About 4 days ago noticed stiffness anterior lateral left neck. No headache or nuchal rigidity. Symptoms progressively worsening and today went to urgent care. She was given a shot of antibiotics and sent here for a CAT scan to further evaluate increasing mass left neck. When she arrived here she was noted to have a fever. Patient denies any chills or fevers otherwise. No rashes. No sore throat. No difficulty breathing. No difficulty swallowing. No recent dental surgeries or surgeries otherwise. She denies any significant pain and declines any pain medications. Since getting Tylenol and triage is feeling somewhat better. She denies any medical problems including history of cancer. Moderate severity. No known aggravating or alleviating factors. History of same. Past Medical History  Diagnosis Date  . Anemia   . Seasonal allergies     Past Surgical History  Procedure Date  . No past surgeries   . Laparoscopy 12/06/2011    Procedure: LAPAROSCOPY OPERATIVE;  Surgeon: Levi Aland, MD;  Location: [facility];  Service: Gynecology;  Laterality: [name];  . Ovarian cyst removal 12/06/2011    Procedure: OVARIAN CYSTECTOMY;  Surgeon: Levi Aland, MD;  Location: [facility];  Service: Gynecology;  Laterality: Bilateral;    No family history on file.  History  Substance Use Topics  . Smoking status: Never Smoker   . Smokeless tobacco: Not on file  . Alcohol Use: No    OB History    Grav Para Term Preterm Abortions TAB SAB Ect Mult Living                  Review of Systems  Constitutional: Negative for fever and chills.  HENT: Positive for neck  stiffness. Negative for neck pain.   Eyes: Negative for pain.  Respiratory: Negative for shortness of breath.   Cardiovascular: Negative for chest pain, palpitations and leg swelling.  Gastrointestinal: Negative for abdominal pain.  Genitourinary: Negative for dysuria.  Musculoskeletal: Negative for back pain.  Skin: Negative for rash.  Neurological: Negative for headaches.  All other systems reviewed and are negative.    Allergies  Review of patient's allergies indicates no known allergies.  Home Medications   Current Outpatient Rx  Name Route Sig Dispense Refill  . ASPIRIN 81 MG PO TABS Oral Take 81 mg by mouth daily. Stopped taking 11/18/11    . FEXOFENADINE HCL 180 MG PO TABS Oral Take 180 mg by mouth daily as needed. For allergies      BP 119/75  Pulse 125  Temp 101.5 F (38.6 C) (Oral)  Resp 18  SpO2 99%  LMP 04/11/2012  Physical Exam  Constitutional: She is oriented to person, place, and time. She appears well-developed and well-nourished.  HENT:  Head: Normocephalic and atraumatic.  Mouth/Throat: Oropharynx is clear and moist. No oropharyngeal exudate.       Uvula midline without tonsillar exudates or swelling. No trismus.  Eyes: Conjunctivae and EOM are normal. Pupils are equal, round, and reactive to light.  Neck: Trachea normal. Neck supple. No tracheal deviation present.       Large Left anterior lateral neck mass and fullness, mild tenderness to palpation. No nuchal rigidity.  Cardiovascular:  Normal rate, regular rhythm, S1 normal, S2 normal and normal pulses.     No systolic murmur is present   No diastolic murmur is present  Pulses:      Radial pulses are 2+ on the right side, and 2+ on the left side.  Pulmonary/Chest: Effort normal and breath sounds normal. She has no wheezes. She has no rhonchi. She has no rales. She exhibits no tenderness.  Abdominal: Soft. Normal appearance and bowel sounds are normal. There is no tenderness. There is no CVA tenderness  and negative Murphy's sign.  Musculoskeletal:       BLE:s Calves nontender, no cords or erythema, negative Homans sign  Neurological: She is alert and oriented to person, place, and time. She has normal strength. No cranial nerve deficit or sensory deficit. GCS eye subscore is 4. GCS verbal subscore is 5. GCS motor subscore is 6.  Skin: Skin is warm and dry. No rash noted. She is not diaphoretic.  Psychiatric: Her speech is normal.       Cooperative and appropriate    ED Course  Procedures (including critical care time)  Results for orders placed during the hospital encounter of 04/26/12  URINALYSIS, ROUTINE W REFLEX MICROSCOPIC      Component Value Range   Color, Urine YELLOW  YELLOW   APPearance CLEAR  CLEAR   Specific Gravity, Urine 1.018  1.005 - 1.030   pH 5.5  5.0 - 8.0   Glucose, UA NEGATIVE  NEGATIVE mg/dL   Hgb urine dipstick TRACE (*) NEGATIVE   Bilirubin Urine NEGATIVE  NEGATIVE   Ketones, ur 40 (*) NEGATIVE mg/dL   Protein, ur NEGATIVE  NEGATIVE mg/dL   Urobilinogen, UA 0.2  0.0 - 1.0 mg/dL   Nitrite NEGATIVE  NEGATIVE   Leukocytes, UA NEGATIVE  NEGATIVE  PREGNANCY, URINE      Component Value Range   Preg Test, Ur NEGATIVE  NEGATIVE  CBC WITH DIFFERENTIAL      Component Value Range   WBC 9.8  4.0 - 10.5 K/uL   RBC 3.92  3.87 - 5.11 MIL/uL   Hemoglobin 11.6 (*) 12.0 - 15.0 g/dL   HCT 21.3 (*) 08.6 - 57.8 %   MCV 89.5  78.0 - 100.0 fL   MCH 29.6  26.0 - 34.0 pg   MCHC 33.0  30.0 - 36.0 g/dL   RDW 46.9  62.9 - 52.8 %   Platelets 474 (*) 150 - 400 K/uL   Neutrophils Relative 75  43 - 77 %   Neutro Abs 7.4  1.7 - 7.7 K/uL   Lymphocytes Relative 13  12 - 46 %   Lymphs Abs 1.3  0.7 - 4.0 K/uL   Monocytes Relative 11  3 - 12 %   Monocytes Absolute 1.1 (*) 0.1 - 1.0 K/uL   Eosinophils Relative 0  0 - 5 %   Eosinophils Absolute 0.0  0.0 - 0.7 K/uL   Basophils Relative 0  0 - 1 %   Basophils Absolute 0.0  0.0 - 0.1 K/uL  COMPREHENSIVE METABOLIC PANEL       Component Value Range   Sodium 136  135 - 145 mEq/L   Potassium 4.0  3.5 - 5.1 mEq/L   Chloride 99  96 - 112 mEq/L   CO2 25  19 - 32 mEq/L   Glucose, Bld 107 (*) 70 - 99 mg/dL   BUN 8  6 - 23 mg/dL   Creatinine, Ser 4.13  0.50 - 1.10 mg/dL  Calcium 10.0  8.4 - 10.5 mg/dL   Total Protein 8.0  6.0 - 8.3 g/dL   Albumin 3.7  3.5 - 5.2 g/dL   AST 15  0 - 37 U/L   ALT 8  0 - 35 U/L   Alkaline Phosphatase 58  39 - 117 U/L   Total Bilirubin 0.3  0.3 - 1.2 mg/dL   GFR calc non Af Amer >90  >90 mL/min   GFR calc Af Amer >90  >90 mL/min  RAPID STREP SCREEN      Component Value Range   Streptococcus, Group A Screen (Direct) NEGATIVE  NEGATIVE  URINE MICROSCOPIC-ADD ON      Component Value Range   Squamous Epithelial / LPF RARE  RARE   WBC, UA 0-2  <3 WBC/hpf   RBC / HPF 0-2  <3 RBC/hpf   Bacteria, UA RARE  RARE   Ct Soft Tissue Neck W Contrast  04/27/2012  *RADIOLOGY REPORT*  Clinical Data: Left sided neck swelling.  Query mass.  CT NECK WITH CONTRAST  Technique:  Multidetector CT imaging of the neck was performed with intravenous contrast.  Contrast:  100 ml Omnipaque 300  Comparison: Ultrasound thyroid 11/13/2007  Findings: There is a large mass in the right thyroid gland measuring 3 x 3 x 3.4 cm.  The mass is hypo dense with respect to the enhancing thyroid gland but density is greater than water suggesting hemorrhagic or infected cyst.  There is infiltration in the subcutaneous fat around the cystic lesion.  This could represent infiltration due to adjacent hemorrhage or infection.  A complex cystic lesion was previously demonstrated in the left thyroid gland on ultrasound of the lesion has enlarged since that time.  There is a small cyst in the right thyroid measuring about 5 mm diameter.  Cervical and carotid vessels are displaced by the thyroid mass but are patent.  No evidence of significant cervical lymphadenopathy.  Mucosal spaces, prevertebral spaces, and chronic spaces appear intact.  No  displacement of cervical fat planes. Salivary glands and muscles of mastication are symmetrical. Visualized portions of the paranasal sinuses and mastoid air cells are not opacified.  Visualized bones appear grossly intact.  No displaced fractures are appreciated.  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  The lesion has enlarged since the prior ultrasound.  Changes are consistent with infected or hemorrhagic cyst.  Original Report Authenticated By: Marlon Pel, M.D.    IV fluids. Labs ordered by triage nurse and reviewed as above. Tylenol for fever. CT scan ordered to further evaluate.CT reviewed as above.    1:52 AM d/w Dr Emeline Darling as above, reviewed CT, recs broad spectrum ABx and will see PT in the am  0200: d/w [facility] resident on call, states PT is unassigned and FP does not admit for [facility].   2:19 AM d/w triad - Dr Mikeal Hawthorne will admit.   MDM   46 year old female with left neck mass fever pain and swelling.  Nursing notes reviewed. Vital signs reviewed. Labs and imaging obtained and reviewed as above. IV antibiotics initiated. Ear nose and throat and medicine consults obtained and plan admission.        Sunnie Nielsen, MD 04/27/12 [phone]

## 2012-04-27 ENCOUNTER — Other Ambulatory Visit: Payer: Self-pay | Admitting: Otolaryngology

## 2012-04-27 ENCOUNTER — Other Ambulatory Visit (HOSPITAL_COMMUNITY)
Admission: RE | Admit: 2012-04-27 | Discharge: 2012-04-27 | Disposition: A | Discharge status: Home / Self Care | Payer: BC Managed Care – PPO | Source: Ambulatory Visit | Attending: Otolaryngology | Admitting: Otolaryngology

## 2012-04-27 ENCOUNTER — Emergency Department (HOSPITAL_COMMUNITY): Payer: BC Managed Care – PPO

## 2012-04-27 ENCOUNTER — Encounter (HOSPITAL_COMMUNITY): Payer: Self-pay | Admitting: Radiology

## 2012-04-27 DIAGNOSIS — R000 Tachycardia, unspecified: Secondary | ICD-10-CM

## 2012-04-27 DIAGNOSIS — E041 Nontoxic single thyroid nodule: Secondary | ICD-10-CM | POA: Insufficient documentation

## 2012-04-27 DIAGNOSIS — D649 Anemia, unspecified: Secondary | ICD-10-CM

## 2012-04-27 DIAGNOSIS — R220 Localized swelling, mass and lump, head: Secondary | ICD-10-CM

## 2012-04-27 LAB — TSH: TSH: 2.674 u[IU]/mL (ref 0.350–4.500)

## 2012-04-27 LAB — BASIC METABOLIC PANEL
Calcium: 8.7 mg/dL (ref 8.4–10.5)
Chloride: 102 mEq/L (ref 96–112)
Creatinine, Ser: 0.61 mg/dL (ref 0.50–1.10)
GFR calc Af Amer: >90 mL/min (ref >90)

## 2012-04-27 LAB — CBC
Platelets: 415 10*3/uL — ABNORMAL HIGH (ref 150–400)
RDW: 13.5 % (ref 11.5–15.5)
WBC: 7.4 10*3/uL (ref 4.0–10.5)

## 2012-04-27 MED ORDER — ONDANSETRON HCL 4 MG PO TABS: 4.0000 mg | ORAL_TABLET | Freq: Four times a day (QID) | ORAL | Status: DC | PRN

## 2012-04-27 MED ORDER — VANCOMYCIN HCL IN DEXTROSE 1-5 GM/200ML-% IV SOLN
1000.0000 mg | Freq: Once | INTRAVENOUS | Status: AC
  Administered 2012-04-27: 1000 mg via INTRAVENOUS
  Filled 2012-04-27: qty 200

## 2012-04-27 MED ORDER — HYDROMORPHONE HCL PF 1 MG/ML IJ SOLN: 1.0000 mg | INTRAMUSCULAR | Status: DC | PRN

## 2012-04-27 MED ORDER — PIPERACILLIN-TAZOBACTAM 3.375 G IVPB
3.3750 g | Freq: Once | INTRAVENOUS | Status: AC
  Administered 2012-04-27: 3.375 g via INTRAVENOUS
  Filled 2012-04-27: qty 50

## 2012-04-27 MED ORDER — PIPERACILLIN-TAZOBACTAM 3.375 G IVPB
3.3750 g | Freq: Three times a day (TID) | INTRAVENOUS | Status: DC
  Administered 2012-04-27: 3.375 g via INTRAVENOUS
  Filled 2012-04-27 (×4): qty 50

## 2012-04-27 MED ORDER — IOHEXOL 300 MG/ML  SOLN: 100.0000 mL | Freq: Once | INTRAMUSCULAR | Status: DC | PRN

## 2012-04-27 MED ORDER — ONDANSETRON HCL 4 MG/2ML IJ SOLN: 4.0000 mg | Freq: Four times a day (QID) | INTRAMUSCULAR | Status: DC | PRN

## 2012-04-27 MED ORDER — ACETAMINOPHEN 325 MG PO TABS: 650.0000 mg | ORAL_TABLET | Freq: Four times a day (QID) | ORAL | Status: DC | PRN

## 2012-04-27 MED ORDER — FLUCONAZOLE 150 MG PO TABS: 150.0000 mg | ORAL_TABLET | Freq: Once | ORAL | Status: AC

## 2012-04-27 MED ORDER — ENSURE COMPLETE PO LIQD: 237.0000 mL | Freq: Every day | ORAL | Status: DC | PRN

## 2012-04-27 MED ORDER — OXYCODONE HCL 5 MG PO TABS: 5.0000 mg | ORAL_TABLET | ORAL | Status: DC | PRN

## 2012-04-27 MED ORDER — OXYCODONE HCL 5 MG PO TABS: 5.0000 mg | ORAL_TABLET | ORAL | Status: AC | PRN

## 2012-04-27 MED ORDER — DOXYCYCLINE HYCLATE 100 MG PO TABS: 100.0000 mg | ORAL_TABLET | Freq: Two times a day (BID) | ORAL | Status: AC

## 2012-04-27 MED ORDER — ACETAMINOPHEN 650 MG RE SUPP: 650.0000 mg | Freq: Four times a day (QID) | RECTAL | Status: DC | PRN

## 2012-04-27 MED ORDER — METHYLPREDNISOLONE 4 MG PO KIT
PACK | ORAL | Status: AC
Start: 2012-04-27 — End: 2012-05-04

## 2012-04-27 MED ORDER — SODIUM CHLORIDE 0.9 % IV SOLN
INTRAVENOUS | Status: DC
  Administered 2012-04-27 (×2): via INTRAVENOUS

## 2012-04-27 NOTE — Consult Note (Signed)
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 She had ultrasound guided biopsies of the cysts on the left that showed benign cyst/colloid contents. No further imaging is seen since then. She presented to the ER last night with a fever and URi symptoms and acute left neck swelling. CT neck was reviewed by me, this revealed a right 5mm thyroid cyst and a large, left 4cm thyroid cyst consistent with hemorrhage or infection. ENT is consulted for drainage of the left thyroid cyst.  Allergies: No Known Allergies  ROS: left neck swelling, otherwise negative x 10 systems except per HPI. PMH:  Past Medical History  Diagnosis Date  . Anemia   . Seasonal allergies     FH: History reviewed. No pertinent family history.  SH:  History   Social History  . Marital Status: Married    Spouse Name: [name]    Number of Children: [name]  . Years of Education: [name]   Occupational History  . Not on file.   Social History Main Topics  . Smoking status: Never Smoker   . Smokeless tobacco: Not on file  . Alcohol Use: No  . Drug Use: No  . Sexually Active:    Other Topics Concern  . Not on file   Social History Narrative  . No narrative on file    PSH:  Past Surgical History  Procedure Date  . No past surgeries   . Laparoscopy 12/06/2011    Procedure: LAPAROSCOPY OPERATIVE;  Surgeon: Levi Aland, MD;  Location: [facility];  Service: Gynecology;  Laterality: [name];  . Ovarian cyst removal 12/06/2011    Procedure: OVARIAN CYSTECTOMY;  Surgeon: Levi Aland, MD;  Location: [facility];  Service: Gynecology;  Laterality: Bilateral;    Physical  Exam: CN 2-12 grossly intact and symmetric. EAC/TMs normal BL. Oral cavity, lips, gums, ororpharynx normal with no masses or lesions. Skin warm and dry. Nasal cavity without polyps or purulence. External nose and ears  without masses or lesions. EOMI, PERRLA.palpation of the neck reveals a palpable, moderately tender left thyroid mass deep to the strap muscles/SCM measuring about 4-5cm.  Procedure Note: 10021-LT left thyroid mass FNA without image guidance. Informed verbal consent was obtained after explaining the risks (including bleeding and infection), benefits and alternatives of the procedure. Verbal timeout was performed prior to the procedure. The left neck skin was cleaned with alcohol and then anesthetized with 2% lidocaine/epinephrine. 3 passe were made into the cystic mass with a 20mL syringe/20 gauge needle. About 10mL of hemorrhagic fluid with thick old, dark brown/red liquid blood was aspirated and placed in formalin for pathology and a culture bottle for anaerobic/aerobic culture. The patient tolerated the procedure with no immediate complications.  A/P: small 5mm right thyroid cyst and large left 4cm left hemorrhagic thyroid cyst. I will write her for Doxycycline, Medrol dose pack, and Diflucan for now and see her back in one week to discuss further management. I discussed left thyroid lobectomy to remove the cyst and prevent future infection/hemorrhage. I discussed the risks or thyroidectomy including need for thyroid hormone replacement, bleeding, infection, hypoparathyroidism/need for calcium/vitamin D supplementation, recurrent laryngeal nerve injury. I will see her back in one week to discuss the pathology/culture.   Melvenia Beam 04/27/2012 11:55 AM

## 2012-04-27 NOTE — Progress Notes (Signed)
DC home with husband. Verbally understood DC instructions. No questions asked.

## 2012-04-27 NOTE — H&P (Signed)
[name] is an 46 y.o. female.   Chief Complaint: Neck pain HPI: A 46 year old female with no significant past medical history except for ovarian cyst removal and history of thyroid aspiration many years ago presenting to the ED with neck pain and fever. She has noticed progressive dysphagia over the last 3 months. Associated with pain on the left side of her neck which is rated as 5/10. She noted the gradual swelling of the part of the neck. And felt a bulge in the area. Patient today started having fever and chills hence she was worried and decided to come to the emergency room. She denied any stridor. No shortness of breath. No nausea vomiting. She had no prior history of thyroid disease. Patient will have a CT neck that showed a large cystic mass which is putting pressure on both the trachea and esophagus on the left lobe of the thyroid. ENT has been consulted and will see patient in the morning.  Past Medical History  Diagnosis Date  . Anemia   . Seasonal allergies     Past Surgical History  Procedure Date  . No past surgeries   . Laparoscopy 12/06/2011    Procedure: LAPAROSCOPY OPERATIVE;  Surgeon: Levi Aland, MD;  Location: [facility];  Service: Gynecology;  Laterality: [name];  . Ovarian cyst removal 12/06/2011    Procedure: OVARIAN CYSTECTOMY;  Surgeon: Levi Aland, MD;  Location: [facility];  Service: Gynecology;  Laterality: Bilateral;    History reviewed. No pertinent family history. Social History:  reports that she has never smoked. She does not have any smokeless tobacco history on file. She reports that she does not drink alcohol or use illicit drugs.  Allergies: No Known Allergies   (Not in a hospital admission)  Results for orders placed during the hospital encounter of 04/26/12 (from the past 48 hour(s))  URINALYSIS, ROUTINE W REFLEX MICROSCOPIC     Status: Abnormal   Collection Time   04/26/12  6:58 PM      Component Value Range Comment   Color, Urine YELLOW   YELLOW    APPearance CLEAR  CLEAR    Specific Gravity, Urine 1.018  1.005 - 1.030    pH 5.5  5.0 - 8.0    Glucose, UA NEGATIVE  NEGATIVE mg/dL    Hgb urine dipstick TRACE (*) NEGATIVE    Bilirubin Urine NEGATIVE  NEGATIVE    Ketones, ur 40 (*) NEGATIVE mg/dL    Protein, ur NEGATIVE  NEGATIVE mg/dL    Urobilinogen, UA 0.2  0.0 - 1.0 mg/dL    Nitrite NEGATIVE  NEGATIVE    Leukocytes, UA NEGATIVE  NEGATIVE   URINE MICROSCOPIC-ADD ON     Status: Normal   Collection Time   04/26/12  6:58 PM      Component Value Range Comment   Squamous Epithelial / LPF RARE  RARE    WBC, UA 0-2  <3 WBC/hpf    RBC / HPF 0-2  <3 RBC/hpf    Bacteria, UA RARE  RARE   PREGNANCY, URINE     Status: Normal   Collection Time   04/26/12  6:59 PM      Component Value Range Comment   Preg Test, Ur NEGATIVE  NEGATIVE   RAPID STREP SCREEN     Status: Normal   Collection Time   04/26/12  6:59 PM      Component Value Range Comment   Streptococcus, Group A Screen (Direct) NEGATIVE  NEGATIVE  CBC WITH DIFFERENTIAL     Status: Abnormal   Collection Time   04/26/12  7:00 PM      Component Value Range Comment   WBC 9.8  4.0 - 10.5 K/uL    RBC 3.92  3.87 - 5.11 MIL/uL    Hemoglobin 11.6 (*) 12.0 - 15.0 g/dL    HCT 65.7 (*) 84.6 - 46.0 %    MCV 89.5  78.0 - 100.0 fL    MCH 29.6  26.0 - 34.0 pg    MCHC 33.0  30.0 - 36.0 g/dL    RDW 96.2  95.2 - 84.1 %    Platelets 474 (*) 150 - 400 K/uL    Neutrophils Relative 75  43 - 77 %    Neutro Abs 7.4  1.7 - 7.7 K/uL    Lymphocytes Relative 13  12 - 46 %    Lymphs Abs 1.3  0.7 - 4.0 K/uL    Monocytes Relative 11  3 - 12 %    Monocytes Absolute 1.1 (*) 0.1 - 1.0 K/uL    Eosinophils Relative 0  0 - 5 %    Eosinophils Absolute 0.0  0.0 - 0.7 K/uL    Basophils Relative 0  0 - 1 %    Basophils Absolute 0.0  0.0 - 0.1 K/uL   COMPREHENSIVE METABOLIC PANEL     Status: Abnormal   Collection Time   04/26/12  7:00 PM      Component Value Range Comment   Sodium 136  135 - 145  mEq/L    Potassium 4.0  3.5 - 5.1 mEq/L    Chloride 99  96 - 112 mEq/L    CO2 25  19 - 32 mEq/L    Glucose, Bld 107 (*) 70 - 99 mg/dL    BUN 8  6 - 23 mg/dL    Creatinine, Ser 3.24  0.50 - 1.10 mg/dL    Calcium 40.1  8.4 - 10.5 mg/dL    Total Protein 8.0  6.0 - 8.3 g/dL    Albumin 3.7  3.5 - 5.2 g/dL    AST 15  0 - 37 U/L    ALT 8  0 - 35 U/L    Alkaline Phosphatase 58  39 - 117 U/L    Total Bilirubin 0.3  0.3 - 1.2 mg/dL    GFR calc non Af Amer >90  >90 mL/min    GFR calc Af Amer >90  >90 mL/min    Ct Soft Tissue Neck W Contrast  04/27/2012  *RADIOLOGY REPORT*  Clinical Data: Left sided neck swelling.  Query mass.  CT NECK WITH CONTRAST  Technique:  Multidetector CT imaging of the neck was performed with intravenous contrast.  Contrast:  100 ml Omnipaque 300  Comparison: Ultrasound thyroid 11/13/2007  Findings: There is a large mass in the right thyroid gland measuring 3 x 3 x 3.4 cm.  The mass is hypo dense with respect to the enhancing thyroid gland but density is greater than water suggesting hemorrhagic or infected cyst.  There is infiltration in the subcutaneous fat around the cystic lesion.  This could represent infiltration due to adjacent hemorrhage or infection.  A complex cystic lesion was previously demonstrated in the left thyroid gland on ultrasound of the lesion has enlarged since that time.  There is a small cyst in the right thyroid measuring about 5 mm diameter.  Cervical and carotid vessels are displaced by the thyroid mass but are patent.  No  evidence of significant cervical lymphadenopathy.  Mucosal spaces, prevertebral spaces, and chronic spaces appear intact.  No displacement of cervical fat planes. Salivary glands and muscles of mastication are symmetrical. Visualized portions of the paranasal sinuses and mastoid air cells are not opacified.  Visualized bones appear grossly intact.  No displaced fractures are appreciated.  IMPRESSION: Complex cystic mass in the left thyroid  gland with increased density and surrounding infiltration.  The lesion has enlarged since the prior ultrasound.  Changes are consistent with infected or hemorrhagic cyst.  Original Report Authenticated By: Marlon Pel, M.D.    Review of Systems  Constitutional: Positive for fever and chills.  HENT: Positive for sore throat. Negative for hearing loss, nosebleeds and congestion.   Eyes: Negative.   Respiratory: Negative.  Negative for stridor.   Cardiovascular: Negative.   Gastrointestinal: Negative.   Genitourinary: Negative.   Musculoskeletal: Negative.   Skin: Negative.   Neurological: Negative.  Negative for headaches.  Endo/Heme/Allergies: Negative.   Psychiatric/Behavioral: Negative.     Blood pressure 119/75, pulse 125, temperature 101.5 F (38.6 C), temperature source Oral, resp. rate 18, last menstrual period 04/11/2012, SpO2 99.00%. Physical Exam  Constitutional: She is oriented to person, place, and time. She appears well-developed and well-nourished.  HENT:  Head: Normocephalic and atraumatic.  Right Ear: External ear normal.  Left Ear: External ear normal.  Nose: Nose normal.  Eyes: Conjunctivae and EOM are normal. Pupils are equal, round, and reactive to light.  Neck: Normal range of motion. Neck supple. No JVD present. Tracheal deviation present. Thyromegaly present.  Cardiovascular: Normal rate, regular rhythm, normal heart sounds and intact distal pulses.   Respiratory: Effort normal and breath sounds normal. No stridor.  GI: Soft. Bowel sounds are normal.  Musculoskeletal: Normal range of motion.  Lymphadenopathy:    She has cervical adenopathy.  Neurological: She is alert and oriented to person, place, and time. She has normal reflexes.  Skin: Skin is warm and dry.  Psychiatric: She has a normal mood and affect. Her behavior is normal. Judgment and thought content normal.     Assessment/Plan A 46 year old female presenting with left thyroid cystic mass  which is seen to be infected versus hemorrhagic. Infection is more favored in the setting of fever and some mild leukocytosis.  Plan #1 infected thyroid cyst: Patient will be admitted and started IV antibiotics, pain control, and watched closely for her airways. She'll be seen by ENT who will plan further surgical intervention. Patient and husband have been briefed extensively on patient's mass with the implications explained to them.  #2 tachycardia: Most likely from the fever. Patient will be given Tylenol for fever control.  Plan #3 normocytic anemia: This is chronic but stable.   GARBA,LAWAL 04/27/2012, 2:37 AM

## 2012-04-27 NOTE — Progress Notes (Signed)
INITIAL ADULT NUTRITION ASSESSMENT Date: 04/27/2012   Time: 11:25 AM  Reason for Assessment: Nutrition Risk Report  ASSESSMENT: Female 46 y.o.  Dx: Thyroid cyst  Hx:  Past Medical History  Diagnosis Date  . Anemia   . Seasonal allergies    Past Surgical History  Procedure Date  . No past surgeries   . Laparoscopy 12/06/2011    Procedure: LAPAROSCOPY OPERATIVE;  Surgeon: Levi Aland, MD;  Location: [facility];  Service: Gynecology;  Laterality: [name];  . Ovarian cyst removal 12/06/2011    Procedure: OVARIAN CYSTECTOMY;  Surgeon: Levi Aland, MD;  Location: [facility];  Service: Gynecology;  Laterality: Bilateral;    Related Meds:     . acetaminophen  650 mg Oral Once  . fentaNYL  50 mcg Intravenous Once  . ondansetron  4 mg Intravenous Once  . piperacillin-tazobactam (ZOSYN)  IV  3.375 g Intravenous Once  . piperacillin-tazobactam (ZOSYN)  IV  3.375 g Intravenous Q8H  . sodium chloride  1,000 mL Intravenous Once  . vancomycin  1,000 mg Intravenous Once   Ht: 5\' 5"  (165.1 cm)  Wt: 166 lb 3.6 oz (75.4 kg)  Ideal Wt: 56.8 kg % Ideal Wt: 132%  Wt Readings from Last 15 Encounters:  04/27/12 166 lb 3.6 oz (75.4 kg)  04/26/12 161 lb 12.8 oz (73.392 kg)  11/29/11 155 lb (70.308 kg)  Usual Wt: 150 - 160 lb % Usual Wt: 107%  Body mass index is 27.66 kg/(m^2). Overweight.  Food/Nutrition Related Hx: Regular diet PTA; swallowing trouble x 3 days  Labs:  CMP     Component Value Date/Time   NA 135 04/27/2012 0535   K 3.5 04/27/2012 0535   CL 102 04/27/2012 0535   CO2 24 04/27/2012 0535   GLUCOSE 118* 04/27/2012 0535   BUN 6 04/27/2012 0535   CREATININE 0.61 04/27/2012 0535   CALCIUM 8.7 04/27/2012 0535   PROT 8.0 04/26/2012 1900   ALBUMIN 3.7 04/26/2012 1900   AST 15 04/26/2012 1900   ALT 8 04/26/2012 1900   ALKPHOS 58 04/26/2012 1900   BILITOT 0.3 04/26/2012 1900   GFRNONAA >90 04/27/2012 0535   GFRAA >90 04/27/2012 0535    No intake or output data in the 24 hours ending  04/27/12 1126  Diet Order: General  Supplements/Tube Feeding: none  IVF:    sodium chloride Last Rate: 100 mL/hr at 04/27/12 0552    Estimated Nutritional Needs:   Kcal:  1500 - 1800 kcal Protein: 75 - 85 grams Fluid:  at least 1.8 liters daily  Pt triggered on Nutrition Risk Report for Problems Chewing/Swallowing. Work-up has revealed a large cystic mass, putting pressure on trach and esophagus.  Pt states weight has been stable and follows Regular diet PTA. Issues with swallowing x 3 days 2/2 mass. Able to eat soft foods. Discussed possibility to switching Regular diet to Dysphagia 3 (Mechanical Soft) however pt declined and stated she would like to choose softer foods from Regular menu. Ate 50% of breakfast this morning. Agreeable to chocolate Ensure daily PRN.  NUTRITION DIAGNOSIS: -Swallowing difficulty (NI-1.1).  Status: Ongoing  RELATED TO: thyroid mass  AS EVIDENCE BY: pt report  MONITORING/EVALUATION(Goals): Goal: Pt to meet >/= 90% of their estimated nutrition needs Monitor: PO intake, weights, labs  EDUCATION NEEDS: -No education needs identified at this time  INTERVENTION: 1. Continue Regular diet consistency per pt request 2. Ensure Complete po daily PRN, each supplement provides 350 kcal and 13 grams of protein. 3.  RD to continue to follow nutrition care plan   DOCUMENTATION CODES Per approved criteria  -Not Applicable   Jarold Motto MS, RD, LDN Pager: [phone] After-hours pager: [phone]

## 2012-04-27 NOTE — Discharge Summary (Signed)
Physician Discharge Summary  [name] ZOX:[id number] DOB: [birth year] DOA: 04/26/2012  PCP:  [facility] [address] Admit date: 05/20/9146 Discharge date: 04/27/2012   Discharge Diagnoses:  Principal Problem:  *Infected Thyroid cyst Active Problems:  Tachycardia  Normocytic anemia   Discharge Condition: Good  Diet recommendation: Regular  History of present illness:  46 year old woman admitted with severe neck pain, swelling and fever  Hospital Course:  Left hemorrhagic thyroid cyst - patient was admitted to the hospital due to severe neck pain, fever, neck stiffness. She was started on broad-spectrum antibiotics for possibility of abscess. She was seen in consultation by Dr. Emeline Darling - and he aspirated the cyst and sent it for cultures. The aspirate was blood, but the cultures were negative for bacterial growth. Dr. Emeline Darling prescribed an antibiotic and prednisone taper and advised the patient to follow up with him in the office. TSH was normal  Procedures:  CT scan neck  Consultations:  ENT  Discharge Exam: Filed Vitals:   04/27/12 1004  BP: 125/83  Pulse: 115  Temp: 99.2 F (37.3 C)  Resp: 20   Filed Vitals:   04/26/12 2320 04/27/12 0319 04/27/12 0348 04/27/12 1004  BP: 119/75 121/80 119/73 125/83  Pulse:  115 104 115  Temp:  98.9 F (37.2 C) 99.2 F (37.3 C) 99.2 F (37.3 C)  TempSrc:  Oral    Resp:  18 18 20   Height:   5\' 5"  (1.651 m)   Weight:   75.4 kg (166 lb 3.6 oz)   SpO2: 99% 100% 100% 100%   General: Alert and oriented Cardiovascular: Regular rate and rhythm Respiratory: Clear to auscultation bilateral  Discharge Instructions  Discharge Orders    Future Appointments: Provider: Department: Dept Phone: Center:   08/02/2012 10:30 AM Radene Gunning [facility] [phone] None   08/02/2012 11:00 AM Lowella Dell, MD [facility] [phone] None     Future Orders Please Complete By Expires   Diet -  low sodium heart healthy      Increase activity slowly        Medication List  As of 04/27/2012 12:37 PM   STOP taking these medications         aspirin 81 MG tablet         TAKE these medications         doxycycline 100 MG tablet   Commonly known as: VIBRA-TABS   Take 1 tablet (100 mg total) by mouth 2 (two) times daily.      fexofenadine 180 MG tablet   Commonly known as: ALLEGRA   Take 180 mg by mouth daily as needed. For allergies      fluconazole 150 MG tablet   Commonly known as: DIFLUCAN   Take 1 tablet (150 mg total) by mouth once.      methylPREDNISolone 4 MG tablet   Commonly known as: MEDROL DOSEPAK   follow package directions      oxyCODONE 5 MG immediate release tablet   Commonly known as: Oxy IR/ROXICODONE   Take 1 tablet (5 mg total) by mouth every 4 (four) hours as needed.           Follow-up Information    Follow up with Melvenia Beam, MD. Schedule an appointment as soon as possible for a visit in 1 week.   Contact information:   [facility] [address] [place] [place] [address] [phone]  The results of significant diagnostics from this hospitalization (including imaging, microbiology, ancillary and laboratory) are listed below for reference.    Significant Diagnostic Studies: Ct Soft Tissue Neck W Contrast  04/27/2012  *RADIOLOGY REPORT*  Clinical Data: Left sided neck swelling.  Query mass.  CT NECK WITH CONTRAST  Technique:  Multidetector CT imaging of the neck was performed with intravenous contrast.  Contrast:  100 ml Omnipaque 300  Comparison: Ultrasound thyroid 11/13/2007  Findings: There is a large mass in the right thyroid gland measuring 3 x 3 x 3.4 cm.  The mass is hypo dense with respect to the enhancing thyroid gland but density is greater than water suggesting hemorrhagic or infected cyst.  There is infiltration in the subcutaneous fat around the cystic lesion.  This  could represent infiltration due to adjacent hemorrhage or infection.  A complex cystic lesion was previously demonstrated in the left thyroid gland on ultrasound of the lesion has enlarged since that time.  There is a small cyst in the right thyroid measuring about 5 mm diameter.  Cervical and carotid vessels are displaced by the thyroid mass but are patent.  No evidence of significant cervical lymphadenopathy.  Mucosal spaces, prevertebral spaces, and chronic spaces appear intact.  No displacement of cervical fat planes. Salivary glands and muscles of mastication are symmetrical. Visualized portions of the paranasal sinuses and mastoid air cells are not opacified.  Visualized bones appear grossly intact.  No displaced fractures are appreciated.  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  The lesion has enlarged since the prior ultrasound.  Changes are consistent with infected or hemorrhagic cyst.  Original Report Authenticated By: Marlon Pel, M.D.    Microbiology: Recent Results (from the past 240 hour(s))  RAPID STREP SCREEN     Status: Normal   Collection Time   04/26/12  6:59 PM      Component Value Range Status Comment   Streptococcus, Group A Screen (Direct) NEGATIVE  NEGATIVE Final      Labs: Basic Metabolic Panel:  Lab 04/27/12 1610 04/26/12 1900  NA 135 136  K 3.5 4.0  CL 102 99  CO2 24 25  GLUCOSE 118* 107*  BUN 6 8  CREATININE 0.61 0.66  CALCIUM 8.7 10.0  MG -- --  PHOS -- --   Liver Function Tests:  Lab 04/26/12 1900  AST 15  ALT 8  ALKPHOS 58  BILITOT 0.3  PROT 8.0  ALBUMIN 3.7   No results found for this basename: LIPASE:5,AMYLASE:5 in the last 168 hours No results found for this basename: AMMONIA:5 in the last 168 hours CBC:  Lab 04/27/12 0535 04/26/12 1900  WBC 7.4 9.8  NEUTROABS -- 7.4  HGB 10.9* 11.6*  HCT 33.5* 35.1*  MCV 88.9 89.5  PLT 415* 474*   Cardiac Enzymes: No  results found for this basename: CKTOTAL:5,CKMB:5,CKMBINDEX:5,TROPONINI:5 in the last 168 hours BNP: BNP (last 3 results) No results found for this basename: PROBNP:3 in the last 8760 hours CBG: No results found for this basename: GLUCAP:5 in the last 168 hours  Time coordinating discharge: 45 minute  Signed:  Fisher Hargadon  [facility] 04/27/2012, 12:37 PM

## 2012-04-30 LAB — TISSUE CULTURE

## 2012-05-02 LAB — ANAEROBIC CULTURE

## 2012-07-26 ENCOUNTER — Other Ambulatory Visit: Payer: Self-pay | Admitting: Otolaryngology

## 2012-08-01 ENCOUNTER — Encounter (HOSPITAL_COMMUNITY): Payer: Self-pay | Admitting: Pharmacy Technician

## 2012-08-01 NOTE — Pre-Procedure Instructions (Signed)
21 [name]  08/01/2012   Your procedure is scheduled on:  Monday, November 4th.  Report to [facility] at 5:30AM.  Call this number if you have problems the morning of surgery: [phone]   Remember:   Do not eat food or drink any liquid:After Midnight.      Take these medicines the morning of surgery with A SIP OF WATER: None   Do not wear jewelry, make-up or nail polish.  Do not wear lotions, powders, or perfumes. You may wear deodorant.  Do not shave 48 hours prior to surgery. Men may shave face and neck.  Do not bring valuables to the hospital.  Contacts, dentures or bridgework may not be worn into surgery.  Leave suitcase in the car. After surgery it may be brought to your room.  For patients admitted to the hospital, checkout time is 11:00 AM the day of discharge.   Patients discharged the day of surgery will not be allowed to drive home.  Name and phone number of your driver: NA  Special Instructions: Shower using CHG 2 nights before surgery and the night before surgery.  If you shower the day of surgery use CHG.  Use special wash - you have one bottle of CHG for all showers.  You should use approximately 1/3 of the bottle for each shower.   Please read over the following fact sheets that you were given: Pain Booklet, Coughing and Deep Breathing and Surgical Site Infection Prevention

## 2012-08-02 ENCOUNTER — Encounter (HOSPITAL_COMMUNITY): Payer: Self-pay

## 2012-08-02 ENCOUNTER — Encounter (HOSPITAL_COMMUNITY)
Admission: RE | Admit: 2012-08-02 | Discharge: 2012-08-02 | Disposition: A | Discharge status: Home / Self Care | Payer: BC Managed Care – PPO | Source: Ambulatory Visit | Attending: Otolaryngology | Admitting: Otolaryngology

## 2012-08-02 ENCOUNTER — Ambulatory Visit: Payer: BC Managed Care – PPO | Admitting: Oncology

## 2012-08-02 ENCOUNTER — Other Ambulatory Visit: Payer: BC Managed Care – PPO | Admitting: Lab

## 2012-08-02 LAB — SURGICAL PCR SCREEN: MRSA, PCR: NEGATIVE

## 2012-08-02 LAB — HCG, SERUM, QUALITATIVE: Preg, Serum: NEGATIVE

## 2012-08-02 LAB — CBC
Hemoglobin: 12 g/dL (ref 12.0–15.0)
MCH: 29.3 pg (ref 26.0–34.0)
MCHC: 32.6 g/dL (ref 30.0–36.0)
Platelets: 486 10*3/uL — ABNORMAL HIGH (ref 150–400)

## 2012-08-12 MED ORDER — CEFAZOLIN SODIUM-DEXTROSE 2-3 GM-% IV SOLR
2.0000 g | INTRAVENOUS | Status: AC
  Administered 2012-08-13: 2 g via INTRAVENOUS
  Filled 2012-08-12: qty 50

## 2012-08-13 ENCOUNTER — Observation Stay (HOSPITAL_COMMUNITY)
Admission: RE | Admit: 2012-08-13 | Discharge: 2012-08-14 | Disposition: A | Discharge status: Home / Self Care | Payer: BC Managed Care – PPO | Source: Ambulatory Visit | Attending: Otolaryngology | Admitting: Otolaryngology

## 2012-08-13 ENCOUNTER — Encounter (HOSPITAL_COMMUNITY): Payer: Self-pay | Admitting: Anesthesiology

## 2012-08-13 ENCOUNTER — Ambulatory Visit (HOSPITAL_COMMUNITY): Payer: BC Managed Care – PPO | Admitting: Anesthesiology

## 2012-08-13 ENCOUNTER — Encounter (HOSPITAL_COMMUNITY): Payer: Self-pay | Admitting: Otolaryngology

## 2012-08-13 ENCOUNTER — Encounter (HOSPITAL_COMMUNITY)
Admission: RE | Disposition: A | Discharge status: Home / Self Care | Payer: Self-pay | Source: Ambulatory Visit | Attending: Otolaryngology

## 2012-08-13 DIAGNOSIS — Z01812 Encounter for preprocedural laboratory examination: Secondary | ICD-10-CM | POA: Insufficient documentation

## 2012-08-13 DIAGNOSIS — Z01818 Encounter for other preprocedural examination: Secondary | ICD-10-CM | POA: Insufficient documentation

## 2012-08-13 DIAGNOSIS — E041 Nontoxic single thyroid nodule: Principal | ICD-10-CM | POA: Insufficient documentation

## 2012-08-13 HISTORY — PX: THYROIDECTOMY: SHX17

## 2012-08-13 SURGERY — THYROIDECTOMY
Anesthesia: General | Site: Neck | Laterality: Left | Wound class: Clean

## 2012-08-13 MED ORDER — DEXTROSE IN LACTATED RINGERS 5 % IV SOLN
INTRAVENOUS | Status: DC
  Administered 2012-08-13: 17:00:00 via INTRAVENOUS

## 2012-08-13 MED ORDER — DEXAMETHASONE SODIUM PHOSPHATE 10 MG/ML IJ SOLN
INTRAMUSCULAR | Status: AC
  Filled 2012-08-13: qty 1

## 2012-08-13 MED ORDER — PROMETHAZINE HCL 25 MG/ML IJ SOLN
25.0000 mg | Freq: Four times a day (QID) | INTRAMUSCULAR | Status: DC | PRN
  Administered 2012-08-13: 25 mg via INTRAVENOUS
  Filled 2012-08-13: qty 1

## 2012-08-13 MED ORDER — PROMETHAZINE HCL 25 MG RE SUPP: 25.0000 mg | Freq: Four times a day (QID) | RECTAL | Status: DC | PRN

## 2012-08-13 MED ORDER — LACTATED RINGERS IV SOLN
INTRAVENOUS | Status: DC | PRN
  Administered 2012-08-13 (×2): via INTRAVENOUS

## 2012-08-13 MED ORDER — MORPHINE SULFATE 2 MG/ML IJ SOLN: 2.0000 mg | INTRAMUSCULAR | Status: DC | PRN

## 2012-08-13 MED ORDER — PHENYLEPHRINE HCL 10 MG/ML IJ SOLN
INTRAMUSCULAR | Status: DC | PRN
  Administered 2012-08-13: 80 ug via INTRAVENOUS
  Administered 2012-08-13: 120 ug via INTRAVENOUS
  Administered 2012-08-13: 80 ug via INTRAVENOUS
  Administered 2012-08-13: 40 ug via INTRAVENOUS

## 2012-08-13 MED ORDER — MIDAZOLAM HCL 5 MG/5ML IJ SOLN
INTRAMUSCULAR | Status: DC | PRN
  Administered 2012-08-13: 2 mg via INTRAVENOUS

## 2012-08-13 MED ORDER — ACETAMINOPHEN 160 MG/5ML PO SOLN: 650.0000 mg | ORAL | Status: DC | PRN

## 2012-08-13 MED ORDER — PROPOFOL 10 MG/ML IV BOLUS
INTRAVENOUS | Status: DC | PRN
  Administered 2012-08-13: 200 mg via INTRAVENOUS

## 2012-08-13 MED ORDER — HYDROMORPHONE HCL PF 1 MG/ML IJ SOLN: 0.2500 mg | INTRAMUSCULAR | Status: DC | PRN

## 2012-08-13 MED ORDER — FENTANYL CITRATE 0.05 MG/ML IJ SOLN
INTRAMUSCULAR | Status: DC | PRN
  Administered 2012-08-13: 50 ug via INTRAVENOUS
  Administered 2012-08-13: 25 ug via INTRAVENOUS
  Administered 2012-08-13: 50 ug via INTRAVENOUS
  Administered 2012-08-13: 125 ug via INTRAVENOUS

## 2012-08-13 MED ORDER — CALCIUM CARBONATE-VITAMIN D 500-200 MG-UNIT PO TABS
1.0000 | ORAL_TABLET | Freq: Three times a day (TID) | ORAL | Status: DC
  Administered 2012-08-13: 1 via ORAL
  Filled 2012-08-13 (×6): qty 1

## 2012-08-13 MED ORDER — ONDANSETRON HCL 4 MG/2ML IJ SOLN
4.0000 mg | INTRAMUSCULAR | Status: DC | PRN
  Administered 2012-08-13: 4 mg via INTRAVENOUS
  Filled 2012-08-13 (×2): qty 2

## 2012-08-13 MED ORDER — LIDOCAINE-EPINEPHRINE 1 %-1:100000 IJ SOLN
INTRAMUSCULAR | Status: AC
  Filled 2012-08-13: qty 1

## 2012-08-13 MED ORDER — BACITRACIN ZINC 500 UNIT/GM EX OINT
TOPICAL_OINTMENT | CUTANEOUS | Status: AC
  Filled 2012-08-13: qty 15

## 2012-08-13 MED ORDER — 0.9 % SODIUM CHLORIDE (POUR BTL) OPTIME
TOPICAL | Status: DC | PRN
  Administered 2012-08-13: 1000 mL

## 2012-08-13 MED ORDER — ZOLPIDEM TARTRATE 5 MG PO TABS: 5.0000 mg | ORAL_TABLET | Freq: Every evening | ORAL | Status: DC | PRN

## 2012-08-13 MED ORDER — ONDANSETRON HCL 4 MG/2ML IJ SOLN: 4.0000 mg | Freq: Once | INTRAMUSCULAR | Status: DC | PRN

## 2012-08-13 MED ORDER — BACITRACIN ZINC 500 UNIT/GM EX OINT: TOPICAL_OINTMENT | CUTANEOUS | Status: DC | PRN

## 2012-08-13 MED ORDER — EPHEDRINE SULFATE 50 MG/ML IJ SOLN
INTRAMUSCULAR | Status: DC | PRN
  Administered 2012-08-13 (×3): 10 mg via INTRAVENOUS

## 2012-08-13 MED ORDER — WHITE PETROLATUM GEL
Status: AC
  Filled 2012-08-13: qty 5

## 2012-08-13 MED ORDER — SUCCINYLCHOLINE CHLORIDE 20 MG/ML IJ SOLN
INTRAMUSCULAR | Status: DC | PRN
  Administered 2012-08-13: 80 mg via INTRAVENOUS

## 2012-08-13 MED ORDER — LIDOCAINE HCL 4 % MT SOLN
OROMUCOSAL | Status: DC | PRN
  Administered 2012-08-13: 4 mL via TOPICAL

## 2012-08-13 MED ORDER — LIDOCAINE-EPINEPHRINE 1 %-1:100000 IJ SOLN
INTRAMUSCULAR | Status: DC | PRN
  Administered 2012-08-13: 2 mL

## 2012-08-13 MED ORDER — ONDANSETRON HCL 4 MG PO TABS: 4.0000 mg | ORAL_TABLET | ORAL | Status: DC | PRN

## 2012-08-13 MED ORDER — ACETAMINOPHEN 650 MG RE SUPP: 650.0000 mg | RECTAL | Status: DC | PRN

## 2012-08-13 MED ORDER — HYDROCODONE-ACETAMINOPHEN 5-325 MG PO TABS: 1.0000 | ORAL_TABLET | Freq: Four times a day (QID) | ORAL | Status: DC | PRN

## 2012-08-13 MED ORDER — DEXAMETHASONE SODIUM PHOSPHATE 10 MG/ML IJ SOLN
10.0000 mg | Freq: Once | INTRAMUSCULAR | Status: AC
  Administered 2012-08-13: 10 mg via INTRAVENOUS

## 2012-08-13 MED ORDER — HYDROCODONE-ACETAMINOPHEN 5-325 MG PO TABS: 1.0000 | ORAL_TABLET | ORAL | Status: DC | PRN

## 2012-08-13 MED ORDER — ONDANSETRON HCL 4 MG/2ML IJ SOLN
INTRAMUSCULAR | Status: DC | PRN
  Administered 2012-08-13: 4 mg via INTRAVENOUS

## 2012-08-13 MED ORDER — LIDOCAINE HCL (CARDIAC) 20 MG/ML IV SOLN
INTRAVENOUS | Status: DC | PRN
  Administered 2012-08-13: 60 mg via INTRAVENOUS

## 2012-08-13 SURGICAL SUPPLY — 47 items
ADH SKN CLS LQ APL DERMABOND (GAUZE/BANDAGES/DRESSINGS) ×1
APPLIER CLIP 9.375 SM OPEN (CLIP)
APR CLP SM 9.3 20 MLT OPN (CLIP)
ATTRACTOMAT 16X20 MAGNETIC DRP (DRAPES) ×1 IMPLANT
CANISTER SUCTION 2500CC (MISCELLANEOUS) ×2 IMPLANT
CLEANER TIP ELECTROSURG 2X2 (MISCELLANEOUS) ×2 IMPLANT
CLIP APPLIE 9.375 SM OPEN (CLIP) IMPLANT
CLOTH BEACON ORANGE TIMEOUT ST (SAFETY) ×2 IMPLANT
CONT SPEC 4OZ CLIKSEAL STRL BL (MISCELLANEOUS) ×2 IMPLANT
CORDS BIPOLAR (ELECTRODE) ×2 IMPLANT
COVER SURGICAL LIGHT HANDLE (MISCELLANEOUS) ×2 IMPLANT
DECANTER SPIKE VIAL GLASS SM (MISCELLANEOUS) ×2 IMPLANT
DERMABOND ADHESIVE PROPEN (GAUZE/BANDAGES/DRESSINGS) ×1
DERMABOND ADVANCED .7 DNX6 (GAUZE/BANDAGES/DRESSINGS) IMPLANT
DRAIN CHANNEL 10F 3/8 F FF (DRAIN) IMPLANT
DRAIN JACKSON RD 7FR 3/32 (WOUND CARE) ×2 IMPLANT
ELECT COATED BLADE 2.86 ST (ELECTRODE) ×2 IMPLANT
ELECT REM PT RETURN 9FT ADLT (ELECTROSURGICAL) ×2
ELECTRODE REM PT RTRN 9FT ADLT (ELECTROSURGICAL) ×1 IMPLANT
EVACUATOR SILICONE 100CC (DRAIN) ×2 IMPLANT
GAUZE SPONGE 4X4 16PLY XRAY LF (GAUZE/BANDAGES/DRESSINGS) ×2 IMPLANT
GLOVE BIOGEL M 7.0 STRL (GLOVE) ×2 IMPLANT
GOWN STRL NON-REIN LRG LVL3 (GOWN DISPOSABLE) ×6 IMPLANT
KIT BASIN OR (CUSTOM PROCEDURE TRAY) ×2 IMPLANT
KIT ROOM TURNOVER OR (KITS) ×2 IMPLANT
LOOP VESSEL MAXI BLUE (MISCELLANEOUS) ×1 IMPLANT
MARKER SKIN DUAL TIP RULER LAB (MISCELLANEOUS) ×2 IMPLANT
NS IRRIG 1000ML POUR BTL (IV SOLUTION) ×2 IMPLANT
PAD ARMBOARD 7.5X6 YLW CONV (MISCELLANEOUS) ×4 IMPLANT
PENCIL BUTTON HOLSTER BLD 10FT (ELECTRODE) ×2 IMPLANT
SHEARS HARMONIC 9CM CVD (BLADE) ×1 IMPLANT
SPONGE INTESTINAL PEANUT (DISPOSABLE) ×2 IMPLANT
STAPLER VISISTAT 35W (STAPLE) ×2 IMPLANT
STRIP CLOSURE SKIN 1/2X4 (GAUZE/BANDAGES/DRESSINGS) IMPLANT
SUT CHROMIC 2 0 SH (SUTURE) IMPLANT
SUT ETHILON 3 0 PS 1 (SUTURE) ×2 IMPLANT
SUT ETHILON 5 0 PS 2 18 (SUTURE) ×2 IMPLANT
SUT SILK 2 0 REEL (SUTURE) IMPLANT
SUT SILK 2 0 SH CR/8 (SUTURE) ×2 IMPLANT
SUT SILK 3 0 REEL (SUTURE) ×2 IMPLANT
SUT SILK 4 0 REEL (SUTURE) ×1 IMPLANT
SUT VIC AB 3-0 FS2 27 (SUTURE) ×1 IMPLANT
SUT VICRYL 4-0 PS2 18IN ABS (SUTURE) ×1 IMPLANT
TOWEL OR 17X24 6PK STRL BLUE (TOWEL DISPOSABLE) ×2 IMPLANT
TOWEL OR 17X26 10 PK STRL BLUE (TOWEL DISPOSABLE) ×2 IMPLANT
TRAY ENT MC OR (CUSTOM PROCEDURE TRAY) ×2 IMPLANT
WATER STERILE IRR 1000ML POUR (IV SOLUTION) ×2 IMPLANT

## 2012-08-13 NOTE — Brief Op Note (Signed)
08/13/2012  9:50 AM  PATIENT:  [name]  46 y.o. female  PRE-OPERATIVE DIAGNOSIS:  left thyroid cyst  POST-OPERATIVE DIAGNOSIS:  left thyroid cyst  PROCEDURE:  Procedure(s) (LRB) with comments: THYROIDECTOMY (Left)  SURGEON:  Surgeon(s) and Role:    * Osborn Coho, MD - Primary  PHYSICIAN ASSISTANT: Nash Dimmer, PA  ASSISTANTS: none   ANESTHESIA:   general  EBL:  Total I/O In: 1500 [I.V.:1500] Out: - <50 cc  BLOOD ADMINISTERED:none  DRAINS: (7 Fr) Jackson-Pratt drain(s) with closed bulb suction in the left neck   LOCAL MEDICATIONS USED:  LIDOCAINE  and Amount: 2 ml  SPECIMEN:  Source of Specimen:  Left Thyroid  DISPOSITION OF SPECIMEN:  PATHOLOGY  COUNTS:  YES  TOURNIQUET:  * No tourniquets in log *  DICTATION: .Other Dictation: Dictation Number [id number]  PLAN OF CARE: Admit for overnight observation  PATIENT DISPOSITION:  PACU - hemodynamically stable.   Delay start of Pharmacological VTE agent (>24hrs) due to surgical blood loss or risk of bleeding: no

## 2012-08-13 NOTE — Progress Notes (Signed)
Post op check  Complains of nausea. She has been drinking well. Her voice is normal. Incision looks excellent, no swelling. JP functioning well.   Table post op, continue overnight observation.

## 2012-08-13 NOTE — Transfer of Care (Signed)
Immediate Anesthesia Transfer of Care Note  Patient: [name]  Procedure(s) Performed: Procedure(s) (LRB) with comments: THYROIDECTOMY (Left)  Patient Location: PACU  Anesthesia Type:General  Level of Consciousness: awake, alert , oriented and patient cooperative  Airway & Oxygen Therapy: Patient Spontanous Breathing and Patient connected to nasal cannula oxygen  Post-op Assessment: Report given to PACU RN, Post -op Vital signs reviewed and stable and Patient moving all extremities X 4  Post vital signs: Reviewed and stable  Complications: No apparent anesthesia complications

## 2012-08-13 NOTE — Anesthesia Procedure Notes (Signed)
Procedure Name: Intubation Date/Time: 08/13/2012 7:41 AM Performed by: Rogelia Boga Pre-anesthesia Checklist: Patient identified, Emergency Drugs available, Suction available, Patient being monitored and Timeout performed Patient Re-evaluated:Patient Re-evaluated prior to inductionOxygen Delivery Method: Circle system utilized Preoxygenation: Pre-oxygenation with 100% oxygen Intubation Type: IV induction Ventilation: Mask ventilation without difficulty Laryngoscope Size: Mac and 4 Grade View: Grade II Tube type: Oral Tube size: 7.5 mm Number of attempts: 1 Airway Equipment and Method: Stylet and LTA kit utilized Placement Confirmation: ETT inserted through vocal cords under direct vision,  positive ETCO2 and breath sounds checked- equal and bilateral Secured at: 21 cm Tube secured with: Tape

## 2012-08-13 NOTE — Anesthesia Preprocedure Evaluation (Addendum)
Anesthesia Evaluation  Patient identified by MRN, date of birth, ID band Patient awake    Reviewed: Allergy & Precautions, H&P , NPO status , Patient's Chart, lab work & pertinent test results  Airway Mallampati: II TM Distance: >3 FB Neck ROM: full    Dental  (+) Dental Advisory Given   Pulmonary          Cardiovascular Rhythm:regular Rate:Normal     Neuro/Psych    GI/Hepatic   Endo/Other    Renal/GU      Musculoskeletal   Abdominal   Peds  Hematology   Anesthesia Other Findings   Reproductive/Obstetrics                          Anesthesia Physical Anesthesia Plan  ASA: I  Anesthesia Plan: General   Post-op Pain Management:    Induction: Intravenous  Airway Management Planned: Oral ETT  Additional Equipment:   Intra-op Plan:   Post-operative Plan: Extubation in OR  Informed Consent: I have reviewed the patients History and Physical, chart, labs and discussed the procedure including the risks, benefits and alternatives for the proposed anesthesia with the patient or authorized representative who has indicated his/her understanding and acceptance.     Plan Discussed with: CRNA, Anesthesiologist and Surgeon  Anesthesia Plan Comments:         Anesthesia Quick Evaluation  

## 2012-08-13 NOTE — H&P (Signed)
[name] is an 46 y.o. female.   Chief Complaint: Lt thyriod cyst HPI: Recurrent lt thyroid cyst, prev aspiration with recurrence  Past Medical History  Diagnosis Date  . Anemia   . Seasonal allergies     Past Surgical History  Procedure Date  . No past surgeries   . Laparoscopy 12/06/2011    Procedure: LAPAROSCOPY OPERATIVE;  Surgeon: Levi Aland, MD;  Location: [facility];  Service: Gynecology;  Laterality: [name];  . Ovarian cyst removal 12/06/2011    Procedure: OVARIAN CYSTECTOMY;  Surgeon: Levi Aland, MD;  Location: [facility];  Service: Gynecology;  Laterality: Bilateral;    No family history on file. Social History:  reports that she has never smoked. She does not have any smokeless tobacco history on file. She reports that she does not drink alcohol or use illicit drugs.  Allergies:  Allergies  Allergen Reactions  . Ginger Hives    Medications Prior to Admission  Medication Sig Dispense Refill  . loratadine-pseudoephedrine (CLARITIN-D 24-HOUR) 10-240 MG per 24 hr tablet Take 1 tablet by mouth daily as needed. For allergies      . Multiple Vitamins-Minerals (MULTIVITAMIN WITH MINERALS) tablet Take 1 tablet by mouth daily.        No results found for this or any previous visit (from the past 48 hour(s)). No results found.  Review of Systems  Constitutional: Negative.   HENT: Negative.   Respiratory: Negative.   Cardiovascular: Negative.   Musculoskeletal: Negative.   Neurological: Negative.     Blood pressure 121/87, temperature 98.4 F (36.9 C), temperature source Oral, resp. rate 16, last menstrual period 07/27/2012, SpO2 100.00%. Physical Exam  Constitutional: She is oriented to person, place, and time. She appears well-developed and well-nourished.  Neck: Normal range of motion. Neck supple. Thyromegaly present.       Left thyroid enlargement  Cardiovascular: Normal rate and regular rhythm.   Respiratory: Effort normal and breath sounds normal.  GI:  Soft.  Musculoskeletal: Normal range of motion.  Neurological: She is alert and oriented to person, place, and time.     Assessment/Plan Adm for lt thyroidectomy, plan O/N Obs  [name] 08/13/2012, 7:24 AM

## 2012-08-13 NOTE — Anesthesia Postprocedure Evaluation (Signed)
  Anesthesia Post-op Note  Patient: [name]  Procedure(s) Performed: Procedure(s) (LRB) with comments: THYROIDECTOMY (Left)  Patient Location: PACU  Anesthesia Type:General  Level of Consciousness: awake, alert , oriented and patient cooperative  Airway and Oxygen Therapy: Patient Spontanous Breathing  Post-op Pain: none  Post-op Assessment: Post-op Vital signs reviewed, Patient's Cardiovascular Status Stable, Respiratory Function Stable, Patent Airway, No signs of Nausea or vomiting and Pain level controlled  Post-op Vital Signs: stable  Complications: No apparent anesthesia complications

## 2012-08-13 NOTE — Op Note (Signed)
NAMEANJANAE, WOEHRLE NO.:  [id number]  MEDICAL RECORD NO.:  [id number]  LOCATION:  [id number]                        FACILITY:  [facility]  PHYSICIAN:  Kinnie Scales. Annalee Genta, M.D.DATE OF BIRTH:  03-Apr-1966  DATE OF PROCEDURE:  08/13/2012 DATE OF DISCHARGE:                              OPERATIVE REPORT   LOCATION:  [facility] Main OR.  PREOPERATIVE DIAGNOSIS:  Recurrent left thyroid hemorrhagic cyst.  POSTOPERATIVE DIAGNOSIS:  Recurrent left thyroid hemorrhagic cyst.  INDICATION FOR SURGERY:  Recurrent left thyroid hemorrhagic cyst.  SURGICAL PROCEDURE:  Left thyroidectomy.  SURGEON:  Kinnie Scales. Annalee Genta, M.D.  ASSISTANT:  Aquilla Hacker, PA-C  ANESTHESIA:  General endotracheal.  COMPLICATIONS:  None.  ESTIMATED BLOOD LOSS:  Less than 50 mL.  The patient was transferred from the operating room to the recovery room in stable condition.  BRIEF HISTORY:  The patient is a 46 year old black female who was referred to our office for evaluation after suffering an acute hemorrhagic swelling in the left thyroid gland.  She was seen by Dr. Emeline Darling and underwent initial evaluation and needle aspiration for a moderate amount of bloody cystic aspirate, this was sent to Pathology, and findings were consistent with hemorrhagic thyroid cyst, no evidence of malignancy.  The patient was stable and the cyst was essentially resolved.  Several months later, she developed increased symptoms of pain and pressure in the left neck.  Followup scan showed recurrent cyst in the lateral aspect of the left thyroid gland.  No other significant findings.  Given her history and physical findings, we recommended left thyroidectomy.  The risks and benefits of the procedure were discussed in detail with the patient who understood and concurred with our plan for surgery, which was scheduled on elective basis at [facility] OR.  SURGICAL PROCEDURE:  The patient was brought to  the operating room on August 13, 2012, and placed in supine position on the operating table. General endotracheal anesthesia was established without difficulty. When the patient was adequately anesthetized, she was positioned on the operating table and prepped and draped in a sterile fashion.  She was injected with a total of 2 mL of 1% lidocaine with 1:100,000 solution epinephrine, injected in subcutaneous fashion along the post-skin incision in the anterior neck after allowing adequate time for vasoconstriction, and hemostasis.  The patient was prepared for surgery.  A 3-cm horizontally oriented incision was created in the anterior neck skin and the preexisting skin crease.  This was carried through the skin and underlying subcutaneous tissue.  The platysma muscle on the left- hand side was identified and subplatysmal flaps were elevated to allow adequate access to the anterior compartment of the neck.  The strap muscles were identified in the midline, divided and lateralized exposing the anterior aspect of the patient's trachea and the thyroid isthmus. Careful palpation of the thyroid gland bilaterally showed no evidence of nodularity or mass on the right.  The patient had a firm mass, which was densely adherent to the surrounding tissues on the left lateral aspect of the anterior compartment consistent with her history of hemorrhagic thyroid cyst.  Using blunt and sharp dissection with Bovie electrocautery, the strap muscles were  carefully dissected from the thyroid lobe and associated cystic mass.  The middle thyroid vein was divided with Harmonic scalpel.  Dissection was then carried along the inferior aspect of the gland and cyst mass removing adhesions and coagulating contributing blood vessels.  The inferior parathyroid gland was identified, its vascular input was carefully preserved and it was reflected inferiorly.  Dissection was then carried along the inferior aspect of the  gland to the level of the lateral trachea.  Dissection was then carried along the anterior aspect of the trachea dividing the thyroid isthmus and lateralizing the left thyroid lobe.  The gland was then carefully freed superiorly.  The superior thyroid vessels were identified and divided.  The superior thyroid artery was suture ligated. The dissection was then carried along the lateral and deep aspect of the gland reflecting it medially.  The recurrent laryngeal nerve was identified in its normal anatomic position in the tracheoesophageal groove and then followed to its insertion along the cricothyroid membrane.  The gland was then dissected from its tracheal attachments and removed, this was sent to Pathology for gross and microscopic evaluation.  The patient's wound was then thoroughly irrigated with sterile saline.  There was no active bleeding.  The wound was closed in multiple layers after placement of a 7-French round drain at the depth of the wound, which was sutured to the skin with a 3-0 Ethilon suture. The strap muscles were medialized and the anterior dissection was closed with interrupted 3-0 Vicryl suture.  The platysma muscle was reapproximated with 4-0 Vicryl suture in an interrupted fashion and subcutaneous closure was achieved with 4-0 interrupted Vicryl horizontal mattressing sutures.  The final skin closure was achieved with Dermabond surgical glue.  The patient was then awakened from her anesthetic.  She was extubated and transferred from the operating room to the recovery room in stable condition.  There were no complications.  The blood loss was less than 50 mL.          ______________________________ Kinnie Scales. Annalee Genta, M.D.     DLS/MEDQ  D:  16/07/9603  T:  08/13/2012  Job:  [id number]  cc:   Epic Chart

## 2012-08-14 ENCOUNTER — Encounter (HOSPITAL_COMMUNITY): Payer: Self-pay | Admitting: Otolaryngology

## 2012-08-14 MED ORDER — PROMETHAZINE HCL 25 MG RE SUPP: 25.0000 mg | Freq: Four times a day (QID) | RECTAL | Status: DC | PRN

## 2012-08-14 NOTE — Discharge Summary (Signed)
Physician Discharge Summary  Patient ID: [name] MRN: [id number] DOB/AGE: 1966-05-29 46 y.o.  Admit date: 08/13/2012 Discharge date: 08/14/2012  Admission Diagnoses:  Principal Problem:  *Thyroid cyst   Discharge Diagnoses:  Same  Surgeries: Procedure(s): THYROIDECTOMY on 08/13/2012   Consultants: none  Discharged Condition: Improved  Hospital Course: [name] is an 46 y.o. female who was admitted 08/13/2012 with a diagnosis of Thyroid cyst and went to the operating room on 08/13/2012 and underwent the above named procedures.   Pt d/c to home in stable condition on pod #1  Recent vital signs:  Filed Vitals:   08/14/12 0700  BP: 116/70  Pulse: 100  Temp: 98.7 F (37.1 C)  Resp: 18    Recent laboratory studies:  Results for orders placed during the hospital encounter of 08/02/12  SURGICAL PCR SCREEN      Component Value Range   MRSA, PCR NEGATIVE  NEGATIVE   Staphylococcus aureus NEGATIVE  NEGATIVE  CBC      Component Value Range   WBC 5.8  4.0 - 10.5 K/uL   RBC 4.09  3.87 - 5.11 MIL/uL   Hemoglobin 12.0  12.0 - 15.0 g/dL   HCT 10.2  72.5 - 36.6 %   MCV 90.0  78.0 - 100.0 fL   MCH 29.3  26.0 - 34.0 pg   MCHC 32.6  30.0 - 36.0 g/dL   RDW 44.0  34.7 - 42.5 %   Platelets 486 (*) 150 - 400 K/uL  HCG, SERUM, QUALITATIVE      Component Value Range   Preg, Serum NEGATIVE  NEGATIVE    Discharge Medications:     Medication List     As of 08/14/2012  8:26 AM    TAKE these medications         HYDROcodone-acetaminophen 5-325 MG per tablet   Commonly known as: NORCO/VICODIN   Take 1-2 tablets by mouth every 6 (six) hours as needed for pain.      loratadine-pseudoephedrine 10-240 MG per 24 hr tablet   Commonly known as: CLARITIN-D 24-hour   Take 1 tablet by mouth daily as needed. For allergies      multivitamin with minerals tablet   Take 1 tablet by mouth daily.      promethazine 25 MG suppository   Commonly known as: PHENERGAN   Place 1  suppository (25 mg total) rectally every 6 (six) hours as needed for nausea.        Diagnostic Studies: Dg Chest 2 View  08/02/2012  *RADIOLOGY REPORT*  Clinical Data: Preop for thyroidectomy  CHEST - 2 VIEW  Comparison: None.  Findings: Cardiomediastinal silhouette is unremarkable.  No acute infiltrate or pleural effusion.  No pulmonary edema.  Bony thorax is unremarkable.  IMPRESSION: No active disease.   Original Report Authenticated By: Natasha Mead, M.D.     Disposition: 01-Home or Self Care      Discharge Orders    Future Orders Please Complete By Expires   Diet - low sodium heart healthy      Increase activity slowly      Discharge instructions      Comments:   1. Limited activity 2. Liquid and soft diet 3. May bathe and shower 4. No ointment on incision 5. Elevate Head of Bed         Signed: Almer Bushey 08/14/2012, 8:26 AM

## 2012-08-14 NOTE — Progress Notes (Signed)
   ENT Progress Note: POD #1 s/p Procedure(s): THYROIDECTOMY   Subjective: Post op N/V, no pain  Objective: Vital signs in last 24 hours: Temp:  [97.2 F (36.2 C)-99.1 F (37.3 C)] 98.7 F (37.1 C) (11/05 0700) Pulse Rate:  [90-113] 100  (11/05 0700) Resp:  [13-73] 18  (11/05 0700) BP: (98-124)/(62-86) 116/70 mmHg (11/05 0700) SpO2:  [98 %-100 %] 99 % (11/05 0700) Weight:  [73.4 kg (161 lb 13.1 oz)] 73.4 kg (161 lb 13.1 oz) (11/04 1253) Weight change:  Last BM Date: 08/12/12  Intake/Output from previous day: 11/04 0701 - 11/05 0700 In: 2690.8 [P.O.:50; I.V.:2640.8] Out: 1841 [Urine:1551; Emesis/NG output:250; Drains:40] Intake/Output this shift:    Studies/Results: No results found.   PHYSICAL EXAM: Inc intact, no erythema JP 10 cc removed   Assessment/Plan: D/C to home     [name] 08/14/2012, 8:20 AM

## 2013-06-27 ENCOUNTER — Other Ambulatory Visit: Payer: Self-pay | Admitting: Gynecology

## 2013-07-05 ENCOUNTER — Other Ambulatory Visit: Payer: Self-pay | Admitting: Gynecology

## 2013-07-05 DIAGNOSIS — R928 Other abnormal and inconclusive findings on diagnostic imaging of breast: Secondary | ICD-10-CM

## 2013-07-16 ENCOUNTER — Ambulatory Visit
Admission: RE | Admit: 2013-07-16 | Discharge: 2013-07-16 | Disposition: A | Discharge status: Home / Self Care | Payer: BC Managed Care – PPO | Source: Ambulatory Visit | Attending: Gynecology | Admitting: Gynecology

## 2013-07-16 ENCOUNTER — Other Ambulatory Visit: Payer: BC Managed Care – PPO

## 2013-07-16 DIAGNOSIS — R928 Other abnormal and inconclusive findings on diagnostic imaging of breast: Secondary | ICD-10-CM

## 2013-09-12 DIAGNOSIS — J3801 Paralysis of vocal cords and larynx, unilateral: Secondary | ICD-10-CM | POA: Insufficient documentation

## 2014-07-03 ENCOUNTER — Other Ambulatory Visit: Payer: Self-pay | Admitting: Gynecology

## 2014-07-04 LAB — CYTOLOGY - PAP

## 2015-02-08 ENCOUNTER — Ambulatory Visit (INDEPENDENT_AMBULATORY_CARE_PROVIDER_SITE_OTHER): Payer: BLUE CROSS/BLUE SHIELD | Admitting: Emergency Medicine

## 2015-02-08 VITALS — BP 112/72 | HR 88 | Temp 98.1°F | Resp 20 | Ht 64.25 in | Wt 161.0 lb

## 2015-02-08 DIAGNOSIS — R079 Chest pain, unspecified: Secondary | ICD-10-CM | POA: Diagnosis not present

## 2015-02-08 MED ORDER — NAPROXEN SODIUM 550 MG PO TABS: 550.0000 mg | ORAL_TABLET | Freq: Two times a day (BID) | ORAL | Status: AC

## 2015-02-08 NOTE — Patient Instructions (Signed)
Costochondritis Costochondritis, sometimes called Tietze syndrome, is a swelling and irritation (inflammation) of the tissue (cartilage) that connects your ribs with your breastbone (sternum). It causes pain in the chest and rib area. Costochondritis usually goes away on its own over time. It can take up to 6 weeks or longer to get better, especially if you are unable to limit your activities. CAUSES  Some cases of costochondritis have no known cause. Possible causes include:  Injury (trauma).  Exercise or activity such as lifting.  Severe coughing. SIGNS AND SYMPTOMS  Pain and tenderness in the chest and rib area.  Pain that gets worse when coughing or taking deep breaths.  Pain that gets worse with specific movements. DIAGNOSIS  Your health care provider will do a physical exam and ask about your symptoms. Chest X-rays or other tests may be done to rule out other problems. TREATMENT  Costochondritis usually goes away on its own over time. Your health care provider may prescribe medicine to help relieve pain. HOME CARE INSTRUCTIONS   Avoid exhausting physical activity. Try not to strain your ribs during normal activity. This would include any activities using chest, abdominal, and side muscles, especially if heavy weights are used.  Apply ice to the affected area for the first 2 days after the pain begins.  Put ice in a plastic bag.  Place a towel between your skin and the bag.  Leave the ice on for 20 minutes, 2-3 times a day.  Only take over-the-counter or prescription medicines as directed by your health care provider. SEEK MEDICAL CARE IF:  You have redness or swelling at the rib joints. These are signs of infection.  Your pain does not go away despite rest or medicine. SEEK IMMEDIATE MEDICAL CARE IF:   Your pain increases or you are very uncomfortable.  You have shortness of breath or difficulty breathing.  You cough up blood.  You have worse chest pains,  sweating, or vomiting.  You have a fever or persistent symptoms for more than 2-3 days.  You have a fever and your symptoms suddenly get worse. MAKE SURE YOU:   Understand these instructions.  Will watch your condition.  Will get help right away if you are not doing well or get worse. Document Released: 07/06/2005 Document Revised: 07/17/2013 Document Reviewed: 04/30/2013 Exit[employer] Patient Information 2015 [employer]. This information is not intended to replace advice given to you by your health care provider. Make sure you discuss any questions you have with your health care provider.  

## 2015-02-08 NOTE — Progress Notes (Signed)
[facility] [address] [place] [address] [phone]  Date:  02/08/2015   Name:  [name]   DOB:  Jul 02, 1966   MRN:  [id number]  PCP:  PROVIDER NOT IN SYSTEM    Chief Complaint: Chest Injury   History of Present Illness:  [name] is a 49 y.o. very pleasant female patient who presents with the following:  Patient says she has a pain in her left upper chest over past month Describes pain as pressure like Usually associated with stress but may also come with use of left arm Lasts seconds to minutes Sometimes associated with palpitations No diaphoresis No radiation of pain No shortness of breath No nausea or vomiting. No stool change n No cough or coryza No history of over use or injury No HBP, DM, HLD non smoker. No improvement with over the counter medications or other home remedies.  Denies other complaint or health concern today.   Patient Active Problem List   Diagnosis Date Noted  . Thyroid cyst 08/13/2012    Class: Chronic  . Tachycardia 04/27/2012  . Normocytic anemia 04/27/2012    Past Medical History  Diagnosis Date  . Anemia   . Seasonal allergies   . Allergy     Past Surgical History  Procedure Laterality Date  . No past surgeries    . Laparoscopy  12/06/2011    Procedure: LAPAROSCOPY OPERATIVE;  Surgeon: Olga Millers, MD;  Location: [place] [place] [facility];  Service: Gynecology;  Laterality: [name];  . Ovarian cyst removal  12/06/2011    Procedure: OVARIAN CYSTECTOMY;  Surgeon: Olga Millers, MD;  Location: [facility];  Service: Gynecology;  Laterality: Bilateral;  . Thyroidectomy  08/13/2012    Procedure: THYROIDECTOMY;  Surgeon: Jerrell Belfast, MD;  Location: [facility];  Service: ENT;  Laterality: Left;    History  Substance Use Topics  . Smoking status: Never Smoker   . Smokeless tobacco: Never Used  . Alcohol Use: No    Family History  Problem Relation Age of Onset  . Hypertension Mother   . Hypertension Father   .  Hypertension Paternal Grandmother     Allergies  Allergen Reactions  . Ginger Hives    Medication list has been reviewed and updated.  Current Outpatient Prescriptions on File Prior to Visit  Medication Sig Dispense Refill  . HYDROcodone-acetaminophen (NORCO) 5-325 MG per tablet Take 1-2 tablets by mouth every 6 (six) hours as needed for pain. (Patient not taking: Reported on 02/08/2015) 30 tablet 0  . loratadine-pseudoephedrine (CLARITIN-D 24-HOUR) 10-240 MG per 24 hr tablet Take 1 tablet by mouth daily as needed. For allergies    . Multiple Vitamins-Minerals (MULTIVITAMIN WITH MINERALS) tablet Take 1 tablet by mouth daily.    . promethazine (PHENERGAN) 25 MG suppository Place 1 suppository (25 mg total) rectally every 6 (six) hours as needed for nausea. (Patient not taking: Reported on 02/08/2015) 12 each 1   No current facility-administered medications on file prior to visit.    Review of Systems:  Review of Systems  Constitutional: Negative for fever, chills and fatigue.  HENT: Negative for congestion, ear pain, hearing loss, postnasal drip, rhinorrhea and sinus pressure.   Eyes: Negative for discharge and redness.  Respiratory: Negative for cough, shortness of breath and wheezing.   Cardiovascular: Negative for leg swelling.  Gastrointestinal: Negative for nausea, vomiting, abdominal pain, constipation and blood in stool.  Genitourinary: Negative for dysuria, urgency and frequency.  Musculoskeletal: Negative for neck stiffness.  Skin: Negative for rash.  Neurological: Negative for seizures, weakness and headaches.     Physical Examination: Filed Vitals:   02/08/15 1040  BP: 112/72  Pulse: 88  Temp: 98.1 F (36.7 C)  Resp: 20   Filed Vitals:   02/08/15 1040  Height: 5' 4.25" (1.632 m)  Weight: 161 lb (73.029 kg)   Body mass index is 27.42 kg/(m^2). Ideal Body Weight: Weight in (lb) to have BMI = 25: 146.5  GEN: WDWN, NAD, Non-toxic, A & O x 3 HEENT: Atraumatic,  Normocephalic. Neck supple. No masses, No LAD. Ears and Nose: No external deformity. CV: RRR, No M/G/R. No JVD. No thrill. No extra heart sounds. PULM: CTA B, no wheezes, crackles, rhonchi. No retractions. No resp. distress. No accessory muscle use.  Has a tender area at 2nd ICS on left sternal border ABD: S, NT, ND, +BS. No rebound. No HSM. EXTR: No c/c/e NEURO Normal gait.  PSYCH: Normally interactive. Conversant. Not depressed or anxious appearing.  Calm demeanor.    Assessment and Plan: Chest pain  EKG normal  Dr Nadyne Coombes Costochondritis which is not reproducing her pain Anaprox   Signed Ellison Carwin, MD

## 2015-02-09 ENCOUNTER — Telehealth: Payer: Self-pay

## 2015-02-09 NOTE — Telephone Encounter (Signed)
Patient is calling because there is some confusion about if or not she's getting a stress test done during her next referral appointment. Please call! [phone]

## 2015-04-03 DIAGNOSIS — R002 Palpitations: Secondary | ICD-10-CM | POA: Insufficient documentation

## 2015-04-03 DIAGNOSIS — N926 Irregular menstruation, unspecified: Secondary | ICD-10-CM | POA: Insufficient documentation

## 2015-04-07 DIAGNOSIS — D473 Essential (hemorrhagic) thrombocythemia: Secondary | ICD-10-CM | POA: Insufficient documentation

## 2015-04-07 DIAGNOSIS — D75839 Thrombocytosis, unspecified: Secondary | ICD-10-CM | POA: Insufficient documentation

## 2015-07-09 ENCOUNTER — Other Ambulatory Visit: Payer: Self-pay | Admitting: Obstetrics

## 2015-07-13 LAB — CYTOLOGY - PAP

## 2015-07-14 ENCOUNTER — Other Ambulatory Visit: Payer: Self-pay | Admitting: Obstetrics

## 2015-07-14 DIAGNOSIS — R928 Other abnormal and inconclusive findings on diagnostic imaging of breast: Secondary | ICD-10-CM

## 2015-07-24 ENCOUNTER — Ambulatory Visit
Admission: RE | Admit: 2015-07-24 | Discharge: 2015-07-24 | Disposition: A | Discharge status: Home / Self Care | Payer: BLUE CROSS/BLUE SHIELD | Source: Ambulatory Visit | Attending: Obstetrics | Admitting: Obstetrics

## 2015-07-24 DIAGNOSIS — R928 Other abnormal and inconclusive findings on diagnostic imaging of breast: Secondary | ICD-10-CM

## 2016-07-11 ENCOUNTER — Other Ambulatory Visit: Payer: Self-pay | Admitting: Obstetrics

## 2016-07-12 LAB — CYTOLOGY - PAP

## 2016-12-31 ENCOUNTER — Other Ambulatory Visit: Payer: Self-pay | Admitting: Oncology

## 2017-01-06 ENCOUNTER — Other Ambulatory Visit: Payer: Self-pay | Admitting: Endocrinology

## 2017-01-06 DIAGNOSIS — E049 Nontoxic goiter, unspecified: Secondary | ICD-10-CM

## 2017-01-10 ENCOUNTER — Ambulatory Visit
Admission: RE | Admit: 2017-01-10 | Discharge: 2017-01-10 | Disposition: A | Discharge status: Home / Self Care | Payer: Self-pay | Source: Ambulatory Visit | Attending: Endocrinology | Admitting: Endocrinology

## 2017-01-10 ENCOUNTER — Other Ambulatory Visit: Payer: Self-pay | Admitting: Endocrinology

## 2017-01-10 DIAGNOSIS — E049 Nontoxic goiter, unspecified: Secondary | ICD-10-CM

## 2017-01-17 ENCOUNTER — Other Ambulatory Visit: Payer: BLUE CROSS/BLUE SHIELD

## 2017-01-17 ENCOUNTER — Ambulatory Visit
Admission: RE | Admit: 2017-01-17 | Discharge: 2017-01-17 | Disposition: A | Discharge status: Home / Self Care | Payer: BLUE CROSS/BLUE SHIELD | Source: Ambulatory Visit | Attending: Endocrinology | Admitting: Endocrinology

## 2017-01-17 ENCOUNTER — Other Ambulatory Visit (HOSPITAL_COMMUNITY)
Admission: RE | Admit: 2017-01-17 | Discharge: 2017-01-17 | Disposition: A | Discharge status: Home / Self Care | Payer: BLUE CROSS/BLUE SHIELD | Source: Ambulatory Visit | Attending: Radiology | Admitting: Radiology

## 2017-01-17 DIAGNOSIS — D34 Benign neoplasm of thyroid gland: Secondary | ICD-10-CM | POA: Diagnosis not present

## 2017-01-17 DIAGNOSIS — E049 Nontoxic goiter, unspecified: Secondary | ICD-10-CM

## 2017-01-17 DIAGNOSIS — E041 Nontoxic single thyroid nodule: Secondary | ICD-10-CM | POA: Diagnosis present

## 2017-01-26 ENCOUNTER — Other Ambulatory Visit: Payer: Self-pay | Admitting: Oncology

## 2017-01-31 ENCOUNTER — Other Ambulatory Visit: Payer: BLUE CROSS/BLUE SHIELD

## 2017-02-03 ENCOUNTER — Ambulatory Visit: Payer: BLUE CROSS/BLUE SHIELD | Admitting: Oncology

## 2017-05-05 ENCOUNTER — Other Ambulatory Visit: Payer: Self-pay | Admitting: *Deleted

## 2017-05-05 DIAGNOSIS — D473 Essential (hemorrhagic) thrombocythemia: Secondary | ICD-10-CM

## 2017-05-05 DIAGNOSIS — D75839 Thrombocytosis, unspecified: Secondary | ICD-10-CM

## 2017-05-08 ENCOUNTER — Other Ambulatory Visit (HOSPITAL_BASED_OUTPATIENT_CLINIC_OR_DEPARTMENT_OTHER): Payer: BLUE CROSS/BLUE SHIELD

## 2017-05-08 DIAGNOSIS — D473 Essential (hemorrhagic) thrombocythemia: Secondary | ICD-10-CM | POA: Diagnosis not present

## 2017-05-08 DIAGNOSIS — D75839 Thrombocytosis, unspecified: Secondary | ICD-10-CM

## 2017-05-08 LAB — DRAW EXTRA CLOT TUBE

## 2017-05-08 LAB — CBC WITH DIFFERENTIAL/PLATELET
BASO%: 0.7 % (ref 0.0–2.0)
Basophils Absolute: 0 10*3/uL (ref 0.0–0.1)
EOS%: 1.3 % (ref 0.0–7.0)
Eosinophils Absolute: 0.1 10*3/uL (ref 0.0–0.5)
HCT: 36.5 % (ref 34.8–46.6)
HEMOGLOBIN: 12.1 g/dL (ref 11.6–15.9)
LYMPH%: 21.3 % (ref 14.0–49.7)
MCH: 29.5 pg (ref 25.1–34.0)
MCHC: 33.1 g/dL (ref 31.5–36.0)
MCV: 89.2 fL (ref 79.5–101.0)
MONO#: 0.5 10*3/uL (ref 0.1–0.9)
MONO%: 9.7 % (ref 0.0–14.0)
NEUT%: 67 % (ref 38.4–76.8)
NEUTROS ABS: 3.3 10*3/uL (ref 1.5–6.5)
PLATELETS: 529 10*3/uL — AB (ref 145–400)
RBC: 4.09 10*6/uL (ref 3.70–5.45)
RDW: 13.4 % (ref 11.2–14.5)
WBC: 5 10*3/uL (ref 3.9–10.3)
lymph#: 1.1 10*3/uL (ref 0.9–3.3)

## 2017-05-08 LAB — COMPREHENSIVE METABOLIC PANEL
ALBUMIN: 3.7 g/dL (ref 3.5–5.0)
ALT: 9 U/L (ref 0–55)
AST: 14 U/L (ref 5–34)
Alkaline Phosphatase: 68 U/L (ref 40–150)
Anion Gap: 8 mEq/L (ref 3–11)
BILIRUBIN TOTAL: 0.47 mg/dL (ref 0.20–1.20)
BUN: 12.7 mg/dL (ref 7.0–26.0)
CO2: 27 mEq/L (ref 22–29)
CREATININE: 0.8 mg/dL (ref 0.6–1.1)
Calcium: 9.6 mg/dL (ref 8.4–10.4)
Chloride: 104 mEq/L (ref 98–109)
Glucose: 90 mg/dl (ref 70–140)
Potassium: 3.9 mEq/L (ref 3.5–5.1)
Sodium: 139 mEq/L (ref 136–145)
TOTAL PROTEIN: 7.3 g/dL (ref 6.4–8.3)

## 2017-05-08 LAB — CHCC SMEAR

## 2017-05-14 ENCOUNTER — Other Ambulatory Visit: Payer: Self-pay | Admitting: Oncology

## 2017-05-15 ENCOUNTER — Ambulatory Visit (HOSPITAL_BASED_OUTPATIENT_CLINIC_OR_DEPARTMENT_OTHER): Payer: BLUE CROSS/BLUE SHIELD | Admitting: Oncology

## 2017-05-15 DIAGNOSIS — D473 Essential (hemorrhagic) thrombocythemia: Secondary | ICD-10-CM | POA: Diagnosis not present

## 2017-05-15 DIAGNOSIS — D75839 Thrombocytosis, unspecified: Secondary | ICD-10-CM

## 2017-05-15 NOTE — Progress Notes (Signed)
[facility]  [phone] Fax:([phone]     ID: [name] DOB: June 14, 1966  MR#: [id number]  MEB#:[id number]  Patient Care Team: Katherina Mires, MD as PCP - General (Family Medicine) Eren Ryser, Virgie Dad, MD as Consulting Physician (Oncology) PCP: Katherina Mires, MD Chauncey Cruel, MD GYN: SU:  OTHER MD:  CHIEF COMPLAINT: Thrombocytosis  CURRENT TREATMENT: Observation  RESEARCH PROTOCOL: None  HISTORY OF PRESENT ILLNESS. [name] has a history of thrombocytosis dating back to 2012. As seen below, the platelet count has been consistently above 400,000 but never as high as 450,000.  Results for Diane, Bowers (MRN [id number]) as of 05/16/2017 07:49  Ref. Range 04/12/2011 12:52 08/02/2011 08:06 11/29/2011 08:53 04/26/2012 19:00 04/27/2012 05:35 08/02/2012 08:58 05/08/2017 08:04  Platelets Latest Ref Range: 145 - 400 10e3/uL 456 (H) 474 (H) 500 (H) 474 (H) 415 (H) 486 (H) 529 (H)   I saw her in 2012 for evaluation of this problem. At that time we obtained a set of labs including a blood film review, which was significant only for some large platelets, but no morphologic changes in the white or red cells. LDH was normal at 138, but the sedimentation rate was mildly elevated at 25, with 22 being the upper normal in this lab. C reactive protein was also mildly elevated at 0.7. A set of cancer antigens were sent all normal and a ferritin was obtained which was slightly low at 32, repeated a year later at 52.  The patient received a diagnosis of thrombocytosis, not otherwise specified, and was lost to follow-up here. More recently she has established herself with Dr. Derrek Monaco who noted an increase in the platelet count and referred her back for further evaluation.  INTERVAL HISTORY: [name] was evaluated in the hematology clinic 05/15/2017  REVIEW OF SYSTEMS: She denies any recent infection or inflammation. There have been no unexplained fatigue, fevers,  drenching sweats, or rash. There has been no adenopathy. She denies strenuous exercise or strenuous work. She is still premenopausal but her periods have become more irregular. A detailed review of systems today was essentially noncontributory  Allergies  Allergen Reactions  . Ginger Hives    Current Outpatient Prescriptions  Medication Sig Dispense Refill  . cetirizine (ZYRTEC) 10 MG tablet Take 10 mg by mouth daily as needed for allergies.    Marland Kitchen HYDROcodone-acetaminophen (NORCO) 5-325 MG per tablet Take 1-2 tablets by mouth every 6 (six) hours as needed for pain. (Patient not taking: Reported on 02/08/2015) 30 tablet 0  . loratadine-pseudoephedrine (CLARITIN-D 24-HOUR) 10-240 MG per 24 hr tablet Take 1 tablet by mouth daily as needed. For allergies    . Multiple Vitamins-Minerals (MULTIVITAMIN WITH MINERALS) tablet Take 1 tablet by mouth daily.    . promethazine (PHENERGAN) 25 MG suppository Place 1 suppository (25 mg total) rectally every 6 (six) hours as needed for nausea. (Patient not taking: Reported on 02/08/2015) 12 each 1   No current facility-administered medications for this visit.     PAST MEDICAL HISTORY: Past Medical History:  Diagnosis Date  . Allergy   . Anemia   . Seasonal allergies     PAST SURGICAL HISTORY: Past Surgical History:  Procedure Laterality Date  . LAPAROSCOPY  12/06/2011   Procedure: LAPAROSCOPY OPERATIVE;  Surgeon: Olga Millers, MD;  Location: [facility];  Service: Gynecology;  Laterality: [name];  . NO PAST SURGERIES    . OVARIAN CYST REMOVAL  12/06/2011   Procedure: OVARIAN CYSTECTOMY;  Surgeon: Olga Millers,  MD;  Location: [facility];  Service: Gynecology;  Laterality: Bilateral;  . THYROIDECTOMY  08/13/2012   Procedure: THYROIDECTOMY;  Surgeon: Jerrell Belfast, MD;  [facility];  Service: ENT;  Laterality: Left;    FAMILY HISTORY Family History  Problem Relation Age of Onset  . Hypertension Mother   . Hypertension Father   . Hypertension Paternal  Grandmother   There is no family history of blood problems to the patient's knowledge. One nephew died at the age of 45 from cancer but the patient does not know what type  GYNECOLOGIC HISTORY:  Menarche age 64, she is GX P0. Her last menstrual period was in May. Her periods are now irregular, last for 5 days of which the first 2 days are heavy.  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 He has a child from a prior marriage.    ADVANCED DIRECTIVES: Not in place  HEALTH MAINTENANCE: Social History  Substance Use Topics  . Smoking status: Never Smoker  . Smokeless tobacco: Never Used  . Alcohol use No     Colonoscopy:Due  PAP: Up-to-date  Bone density:  Lipid panel:  Mammography: Due September 2018    OBJECTIVE: Young-appearing African-American woman in no acute distress  Vitals:   05/15/17 1001  BP: 128/78  Pulse: 89  Resp: 18  Temp: 98.3 F (36.8 C)     Body mass index is 28.73 kg/m.    ECOG FS:0 - Asymptomatic  Ocular: Sclerae unicteric, pupils equal, round and reactive to light Ear-nose-throat: Oropharynx clear, dentition fair Lymphatic: No cervical or supraclavicular adenopathy Lungs no rales or rhonchi, good excursion bilaterally Heart regular rate and rhythm, no murmur appreciated Abd soft, nontender, positive bowel sounds; no splenomegaly by palpation  MSK no focal spinal tenderness, no joint edema Neuro: non-focal, well-oriented, appropriate affect Breasts: Deferred   LAB RESULTS:  CMP     Component Value Date/Time   NA 139 05/08/2017 0804   K 3.9 05/08/2017 0804   CL 102 04/27/2012 0535   CO2 27 05/08/2017 0804   GLUCOSE 90 05/08/2017 0804   BUN 12.7 05/08/2017 0804   CREATININE 0.8 05/08/2017 0804   CALCIUM 9.6 05/08/2017 0804   PROT 7.3 05/08/2017 0804   ALBUMIN 3.7 05/08/2017 0804   AST 14 05/08/2017 0804   ALT 9 05/08/2017 0804   ALKPHOS 68 05/08/2017 0804   BILITOT 0.47 05/08/2017 0804    GFRNONAA >90 04/27/2012 0535   GFRAA >90 04/27/2012 0535    No results found for: KPAFRELGTCHN, LAMBDASER, KAPLAMBRATIO  No results found for: Will Bonnet, GAMS, MSPIKE, SPEI  Lab Results  Component Value Date   WBC 5.0 05/08/2017   NEUTROABS 3.3 05/08/2017   HGB 12.1 05/08/2017   HCT 36.5 05/08/2017   MCV 89.2 05/08/2017   PLT 529 (H) 05/08/2017      Chemistry      Component Value Date/Time   NA 139 05/08/2017 0804   K 3.9 05/08/2017 0804   CL 102 04/27/2012 0535   CO2 27 05/08/2017 0804   BUN 12.7 05/08/2017 0804   CREATININE 0.8 05/08/2017 0804      Component Value Date/Time   CALCIUM 9.6 05/08/2017 0804   ALKPHOS 68 05/08/2017 0804   AST 14 05/08/2017 0804   ALT 9 05/08/2017 0804   BILITOT 0.47 05/08/2017 0804       Lab Results  Component Value Date   LABCA2 5 04/12/2011  No components found for: LABCA125  No results for input(s): INR in the last 168 hours.  Urinalysis    Component Value Date/Time   COLORURINE YELLOW 04/26/2012 1858   APPEARANCEUR CLEAR 04/26/2012 1858   LABSPEC 1.018 04/26/2012 1858   PHURINE 5.5 04/26/2012 1858   GLUCOSEU NEGATIVE 04/26/2012 1858   HGBUR TRACE (A) 04/26/2012 1858   BILIRUBINUR NEGATIVE 04/26/2012 1858   KETONESUR 40 (A) 04/26/2012 1858   PROTEINUR NEGATIVE 04/26/2012 1858   UROBILINOGEN 0.2 04/26/2012 1858   NITRITE NEGATIVE 04/26/2012 1858   LEUKOCYTESUR NEGATIVE 04/26/2012 1858    RADIOLOGY AND OTHER STUDIES: Thyroid ultrasound of 12/29/2016 found an enlarged right lobe, the patient being status post left lobectomy. There was a dominant 2.8 cm complex cystic right lower pole nodule   ASSESSMENT: 52 y.o. [place] woman with a history of thrombocytosis going back at least to 2012, recently with a somewhat higher platelet count  (1) JAK 2 results pending  (2) mild increase in ESR and C-reactive protein noted 2012, with normal ferritin and negative screening test  for malignancy  PLAN: I spent approximately 50 minutes today with the patient reviewing the physiology of blood cells and specifically the issue of thrombocytosis. She understands that all blood cells were made in the marrow of bones. There are 3 major cell lines, red cells, which carry oxygen, white cells, which are are immune system, and the platelets, which are clotting cells.   When we see derangements in all 3 cell lines then we suspect a primary bone marrow problem and at that point we proceed to bone marrow biopsy. When the derangement is in a single cell line then the marrow itself is expected to be normal and the reasons for the abnormality in the cell line needs to be sought  in the cell line itself  Of course abnormalities may be numeric or morphologic. Review of her peripheral blood film does not show any morphologic abnormalities in the white cells or red cells. Platelets themselves generally do not show morphologic abnormalities.  She then has a persistent numeric abnormality in her platelet count namely a platelet count persistently greater than 400,000, but less than 550,000 on numerous determinations over the past 6 years.  Primary thrombocytosis is a condition in which a persistently high platelet count is not due to any secondary cause and therefore it is important to rule out those secondary causes. Platelet counts may go up secondary to inflammation, secondary to infection, secondary to trauma or bleeding including vigorous exercise, and secondary to iron deficiency. Primary thrombocytosis can be associated with a Jak 2 abnormality.  All the relevant labs are being obtained. I had hoped we would have the JAK 2 results by today but unfortunately they are not yet available. Tiffanni will have additional labs later this month and then see me again in September. At that point we should be able to make a definitive determination  Note that if thrombocytosis is reactive, which is the  most common cause, it is not generally associated with thrombotic tendency and therefore I am not recommending we start aspirin at this point. Also a platelet count under 600,000 even if it is primary thrombocytosis does not warrant treatment with anagrelide or similar agents.  At this point and we are obtaining further lab work and pending that mildly suggestion at this point is that she have a CBC drawn every 6 months indefinitely so that if there is a definite upward steady trend in the platelet count that  can be picked up at an early stage.  The patient has a good understanding of the overall plan. She agrees with it. She will call with any problems that may develop before her next visit here.  Chauncey Cruel, MD   05/16/2017 7:49 AM Medical Oncology and Hematology [facility] [address] [place],  [address] Tel. [phone]    Fax. [phone]

## 2017-05-24 ENCOUNTER — Other Ambulatory Visit: Payer: Self-pay | Admitting: Oncology

## 2017-05-24 ENCOUNTER — Telehealth: Payer: Self-pay

## 2017-05-24 NOTE — Telephone Encounter (Signed)
LVM with pt informing of normal JAK 2 results and reminder of lab appt on 08/27 at 0800

## 2017-06-05 ENCOUNTER — Other Ambulatory Visit (HOSPITAL_BASED_OUTPATIENT_CLINIC_OR_DEPARTMENT_OTHER): Payer: BLUE CROSS/BLUE SHIELD

## 2017-06-05 ENCOUNTER — Other Ambulatory Visit: Payer: BLUE CROSS/BLUE SHIELD

## 2017-06-05 DIAGNOSIS — D473 Essential (hemorrhagic) thrombocythemia: Secondary | ICD-10-CM

## 2017-06-05 DIAGNOSIS — D75839 Thrombocytosis, unspecified: Secondary | ICD-10-CM

## 2017-06-05 LAB — CBC WITH DIFFERENTIAL/PLATELET
BASO%: 0.9 % (ref 0.0–2.0)
Basophils Absolute: 0 10*3/uL (ref 0.0–0.1)
EOS ABS: 0.1 10*3/uL (ref 0.0–0.5)
EOS%: 1.4 % (ref 0.0–7.0)
HCT: 36 % (ref 34.8–46.6)
HEMOGLOBIN: 12 g/dL (ref 11.6–15.9)
LYMPH%: 18.9 % (ref 14.0–49.7)
MCH: 30.2 pg (ref 25.1–34.0)
MCHC: 33.3 g/dL (ref 31.5–36.0)
MCV: 90.6 fL (ref 79.5–101.0)
MONO#: 0.5 10*3/uL (ref 0.1–0.9)
MONO%: 10.4 % (ref 0.0–14.0)
NEUT#: 3.3 10*3/uL (ref 1.5–6.5)
NEUT%: 68.4 % (ref 38.4–76.8)
Platelets: 597 10*3/uL — ABNORMAL HIGH (ref 145–400)
RBC: 3.98 10*6/uL (ref 3.70–5.45)
RDW: 14.1 % (ref 11.2–14.5)
WBC: 4.9 10*3/uL (ref 3.9–10.3)
lymph#: 0.9 10*3/uL (ref 0.9–3.3)

## 2017-06-05 LAB — SEDIMENTATION RATE: Sedimentation Rate-Westergren: 12 mm/hr (ref 0–40)

## 2017-06-05 LAB — FERRITIN: Ferritin: 99 ng/ml (ref 9–269)

## 2017-06-06 LAB — ANTINUCLEAR ANTIBODIES, IFA: ANA Titer 1: NEGATIVE

## 2017-06-26 ENCOUNTER — Ambulatory Visit: Payer: BLUE CROSS/BLUE SHIELD | Admitting: Oncology

## 2017-06-26 ENCOUNTER — Other Ambulatory Visit: Payer: BLUE CROSS/BLUE SHIELD

## 2017-06-26 ENCOUNTER — Ambulatory Visit (HOSPITAL_BASED_OUTPATIENT_CLINIC_OR_DEPARTMENT_OTHER): Payer: BLUE CROSS/BLUE SHIELD | Admitting: Oncology

## 2017-06-26 ENCOUNTER — Telehealth: Payer: Self-pay | Admitting: Oncology

## 2017-06-26 ENCOUNTER — Other Ambulatory Visit (HOSPITAL_BASED_OUTPATIENT_CLINIC_OR_DEPARTMENT_OTHER): Payer: BLUE CROSS/BLUE SHIELD

## 2017-06-26 VITALS — BP 126/87 | HR 94 | Temp 98.3°F | Resp 18 | Ht 64.25 in | Wt 169.8 lb

## 2017-06-26 DIAGNOSIS — D75839 Thrombocytosis, unspecified: Secondary | ICD-10-CM

## 2017-06-26 DIAGNOSIS — D473 Essential (hemorrhagic) thrombocythemia: Secondary | ICD-10-CM | POA: Diagnosis not present

## 2017-06-26 LAB — CBC WITH DIFFERENTIAL/PLATELET
BASO%: 1.2 % (ref 0.0–2.0)
Basophils Absolute: 0.1 10*3/uL (ref 0.0–0.1)
EOS ABS: 0.1 10*3/uL (ref 0.0–0.5)
EOS%: 1.5 % (ref 0.0–7.0)
HCT: 35.4 % (ref 34.8–46.6)
HGB: 11.6 g/dL (ref 11.6–15.9)
LYMPH%: 19.5 % (ref 14.0–49.7)
MCH: 29.9 pg (ref 25.1–34.0)
MCHC: 32.9 g/dL (ref 31.5–36.0)
MCV: 90.8 fL (ref 79.5–101.0)
MONO#: 0.4 10*3/uL (ref 0.1–0.9)
MONO%: 7.4 % (ref 0.0–14.0)
NEUT#: 4 10*3/uL (ref 1.5–6.5)
NEUT%: 70.4 % (ref 38.4–76.8)
PLATELETS: 563 10*3/uL — AB (ref 145–400)
RBC: 3.89 10*6/uL (ref 3.70–5.45)
RDW: 14 % (ref 11.2–14.5)
WBC: 5.6 10*3/uL (ref 3.9–10.3)
lymph#: 1.1 10*3/uL (ref 0.9–3.3)

## 2017-06-26 NOTE — Telephone Encounter (Signed)
Gave patient avs and calendar with appts.  °

## 2017-06-26 NOTE — Progress Notes (Signed)
[facility]  Telephone:(336) [phone] Fax:(336) [phone]     ID: [name] DOB: 1965-12-16  MR#: [id number]  MHD#:[id number]  Patient Care Team: Katherina Mires, MD as PCP - General (Family Medicine) Magrinat, Virgie Dad, MD as Consulting Physician (Oncology) Jerelyn Charles, MD as Consulting Physician (Obstetrics) PCP: Katherina Mires, MD Chauncey Cruel, MD GYN: SU:  OTHER MD:  CHIEF COMPLAINT: Thrombocytosis  CURRENT TREATMENT: Observation  RESEARCH PROTOCOL: None  INTERVAL HISTORY: [name] returns today to review the final results of her workup last month. She has been started on oral iron supplementation, taking a tablet twice daily, and is tolerating that with no side effects that she is aware of.  REVIEW OF SYSTEMS: They have been no intercurrent infections, fever, rash, bleeding, pruritus, unexplained fatigue, unexplained weight loss, drenching sweats, or any change in bowel or bladder habits. She generally feels well. A detailed review of systems today was noncontributory  HISTORY OF PRESENT ILLNESS. [name] has a history of thrombocytosis dating back to 2012. As seen below, the platelet count has been consistently above 400,000 but never as high as 450,000.  Results for Diane, Bowers (MRN [id number]) as of 05/16/2017 07:49  Ref. Range 04/12/2011 12:52 08/02/2011 08:06 11/29/2011 08:53 04/26/2012 19:00 04/27/2012 05:35 08/02/2012 08:58 05/08/2017 08:04  Platelets Latest Ref Range: 145 - 400 10e3/uL 456 (H) 474 (H) 500 (H) 474 (H) 415 (H) 486 (H) 529 (H)   I saw her in 2012 for evaluation of this problem. At that time we obtained a set of labs including a blood film review, which was significant only for some large platelets, but no morphologic changes in the white or red cells. LDH was normal at 138, but the sedimentation rate was mildly elevated at 25, with 22 being the upper normal in this lab. C reactive protein was also mildly elevated at 0.7. A set of  cancer antigens were sent all normal and a ferritin was obtained which was slightly low at 32, repeated a year later at 52.  The patient received a diagnosis of thrombocytosis, not otherwise specified, and was lost to follow-up here. More recently she has established herself with Dr. Derrek Monaco who noted an increase in the platelet count and referred her back for further evaluation.    Allergies  Allergen Reactions  . Ginger Hives    Current Outpatient Prescriptions  Medication Sig Dispense Refill  . cetirizine (ZYRTEC) 10 MG tablet Take 10 mg by mouth daily as needed for allergies.    Marland Kitchen loratadine-pseudoephedrine (CLARITIN-D 24-HOUR) 10-240 MG per 24 hr tablet Take 1 tablet by mouth daily as needed. For allergies    . Multiple Vitamins-Minerals (MULTIVITAMIN WITH MINERALS) tablet Take 1 tablet by mouth daily.     No current facility-administered medications for this visit.     PAST MEDICAL HISTORY: Past Medical History:  Diagnosis Date  . Allergy   . Anemia   . Seasonal allergies     PAST SURGICAL HISTORY: Past Surgical History:  Procedure Laterality Date  . LAPAROSCOPY  12/06/2011   Procedure: LAPAROSCOPY OPERATIVE;  Surgeon: Olga Millers, MD;  Location: [facility];  Service: Gynecology;  Laterality: [name];  . NO PAST SURGERIES    . OVARIAN CYST REMOVAL  12/06/2011   Procedure: OVARIAN CYSTECTOMY;  Surgeon: Olga Millers, MD;  Location: [facility];  Service: Gynecology;  Laterality: Bilateral;  . THYROIDECTOMY  08/13/2012   Procedure: THYROIDECTOMY;  Surgeon: Jerrell Belfast, MD;  Location: [facility];  Service: ENT;  Laterality:  Left;    FAMILY HISTORY Family History  Problem Relation Age of Onset  . Hypertension Mother   . Hypertension Father   . Hypertension Paternal Grandmother   There is no family history of blood problems to the patient's knowledge. One nephew died at the age of 25 from cancer but the patient does not know what type  GYNECOLOGIC HISTORY:  Menarche age 48, she  is GX P0. Her last menstrual period was in May. Her periods are now irregular, last for 5 days of which the first 2 days are heavy.  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 He has a child from a prior marriage.    ADVANCED DIRECTIVES: Not in place  HEALTH MAINTENANCE: Social History  Substance Use Topics  . Smoking status: Never Smoker  . Smokeless tobacco: Never Used  . Alcohol use No     Colonoscopy:Due  PAP: Up-to-date  Bone density:  Lipid panel:  Mammography: Due September 2018    OBJECTIVE: Young-appearing African-American womanWho appears well  Vitals:   06/26/17 0837  BP: 126/87  Pulse: 94  Resp: 18  Temp: 98.3 F (36.8 C)  SpO2: 100%     Body mass index is 28.92 kg/m.    ECOG FS:0 - Asymptomatic   LAB RESULTS:  CMP     Component Value Date/Time   NA 139 05/08/2017 0804   K 3.9 05/08/2017 0804   CL 102 04/27/2012 0535   CO2 27 05/08/2017 0804   GLUCOSE 90 05/08/2017 0804   BUN 12.7 05/08/2017 0804   CREATININE 0.8 05/08/2017 0804   CALCIUM 9.6 05/08/2017 0804   PROT 7.3 05/08/2017 0804   ALBUMIN 3.7 05/08/2017 0804   AST 14 05/08/2017 0804   ALT 9 05/08/2017 0804   ALKPHOS 68 05/08/2017 0804   BILITOT 0.47 05/08/2017 0804   GFRNONAA >90 04/27/2012 0535   GFRAA >90 04/27/2012 0535    No results found for: KPAFRELGTCHN, LAMBDASER, KAPLAMBRATIO  No results found for: Will Bonnet, GAMS, MSPIKE, SPEI  Lab Results  Component Value Date   WBC 5.6 06/26/2017   NEUTROABS 4.0 06/26/2017   HGB 11.6 06/26/2017   HCT 35.4 06/26/2017   MCV 90.8 06/26/2017   PLT 563 (H) 06/26/2017      Chemistry      Component Value Date/Time   NA 139 05/08/2017 0804   K 3.9 05/08/2017 0804   CL 102 04/27/2012 0535   CO2 27 05/08/2017 0804   BUN 12.7 05/08/2017 0804   CREATININE 0.8 05/08/2017 0804      Component Value Date/Time   CALCIUM 9.6 05/08/2017 0804   ALKPHOS  68 05/08/2017 0804   AST 14 05/08/2017 0804   ALT 9 05/08/2017 0804   BILITOT 0.47 05/08/2017 0804       Lab Results  Component Value Date   LABCA2 5 04/12/2011    No components found for: [id number]  No results for input(s): INR in the last 168 hours.  Urinalysis    Component Value Date/Time   COLORURINE YELLOW 04/26/2012 1858   APPEARANCEUR CLEAR 04/26/2012 1858   LABSPEC 1.018 04/26/2012 1858   PHURINE 5.5 04/26/2012 1858   GLUCOSEU NEGATIVE 04/26/2012 1858   HGBUR TRACE (A) 04/26/2012 1858   BILIRUBINUR NEGATIVE 04/26/2012 1858   KETONESUR 40 (A) 04/26/2012 [place] 04/26/2012 1858   UROBILINOGEN 0.2 04/26/2012 1858   NITRITE NEGATIVE 04/26/2012 1858   LEUKOCYTESUR  NEGATIVE 04/26/2012 1858    RADIOLOGY AND OTHER STUDIES: No results found.  ASSESSMENT: 51 y.o. [place] woman with a history of thrombocytosis going back at least to 2012, recently with a somewhat higher platelet count  (1) JAK 2 results  Negative  (2) mild increase in ESR and C-reactive protein noted 2012, with normal ferritin and negative screening test for malignancy  (3) repeat workup August 2018 included a negative ANA, a normal sedimentation rate at 12, an adequate ferritin at 99, in the setting of a high platelet count at 597, without any morphologic abnormalities on smear.  PLAN: Today we reviewed the most recent labs and they show a negative ANA, and normal sedimentation rate, and a negative JAK2 mutation. Her ferritin is 99, which is quite good. She is tolerating her iron supplements well.  Her platelet count today remains elevated but there is no trend.  At this point I do not have data to suggest primary thrombocytosis. On the other hand I also have not identified the cause of her persistently elevated platelets.  At this point this requires only follow-up. She is going to see Dr. Doreene Nest in October with labs. She will have lab work here in February and June of next year  and see me with the June lab results. Assuming everything remains stable we will probably change the follow-up to once a year, but of course if there is a definite trend upwards she will need a bone marrow biopsy  She is concerned that she may develop too high and iron level if she continues to take 2 tablets daily. I reassured her that that would not be the case as the body adjusts the degree of iron absorption depending on iron load. Unless she has hemochromatosis or multiple blood transfusions, iron overload is not a concern  She has a good understanding of this plan. She agrees with it. She will call with any issues that may develop before the next visit here.  Chauncey Cruel, MD   06/26/2017 8:53 AM Medical Oncology and Hematology [facility] [address] [place], [place] [address] Tel. [phone]    Fax. [phone]

## 2017-07-14 ENCOUNTER — Encounter: Payer: Self-pay | Admitting: Internal Medicine

## 2017-07-14 ENCOUNTER — Telehealth: Payer: Self-pay | Admitting: *Deleted

## 2017-07-14 NOTE — Telephone Encounter (Signed)
Pt called to this RN to state concern due to recent " EOB" stating pt may be responsible for over $1700 of the testing done on 05/08/2017.  " per the explanation - there are 7 line items and only 1 shows payment and then the other 6 states need for additional clinical data "  [name] has not received a bill but she is concerned and is trying to follow up on above.  This RN noted test for 05/08/2017 is related to her thrombocytosis - and lab was a send out under [facility].  This RN contacted [facility] and was informed presently pt's bill of [phone] has insurance approval for $2000- with account showing " still processing ".  This RN attempted to reach insurance provider for review of status and need for additional data- call was processed by an automated system that eventually transferred this RN to a hold line for representative. This RN held for greater then 15 minutes but had to d/c hold to provide care in the clinic.  This RN later contacted our managed care department - due to above being a send out lab - inquiries need to be directed to the lab manager.  This RN left a written message regarding all the above for follow up on 07/17/2017 due to lab manager currently at one of the satellite offices today.  Pt informed of the above with plan for further contact next week.  Note: total time of above interaction 1 hour.

## 2017-07-28 ENCOUNTER — Telehealth: Payer: Self-pay | Admitting: Medical Oncology

## 2017-07-28 NOTE — Telephone Encounter (Signed)
Frustrated that [employer] has not received information  to support lab testing done here on July 30 `( see under media) . Insurance is denying paying for test. ([facility] is vendor).Pt account number is [id number], claim number [id number]. Pt states insurance needs hx, progress note, lab report, final prognosis, etc to support test. Note sent to [place] and managed care. Pt request nurse to call her back on monday.

## 2017-08-02 ENCOUNTER — Other Ambulatory Visit: Payer: Self-pay | Admitting: *Deleted

## 2017-08-02 ENCOUNTER — Encounter: Payer: Self-pay | Admitting: *Deleted

## 2017-08-10 ENCOUNTER — Encounter: Payer: Self-pay | Admitting: *Deleted

## 2017-08-11 NOTE — Telephone Encounter (Signed)
Update per pt's concerns- this RN was able to fax all appropriate records and letter of medical necessity per Bernitta's contact with her insurance company - and obtaining direct number for provider of [phone].  Per this RN contacted them - informed that pt has not been billed and file is still open- fax number of [phone] given for records to be sent.  Of note - due to billing is from an outside lab - request for records could not be obtained- [facility] did not contact this office to inform or request records.   At present records will be reviewed for further coverage of benefits per medical necessity.  This RN contacted pt to update her of current status of situation.

## 2017-08-14 ENCOUNTER — Telehealth: Payer: Self-pay | Admitting: *Deleted

## 2017-08-14 NOTE — Telephone Encounter (Signed)
-----   Message from Milda Smart sent at 08/14/2017 11:24 AM EST ----- Regarding: RE: lab bill info Contact: [phone] I sent an email to the reference lab asking what information they need to fill with insurance. I am waiting on his response. My apologies for this taking so long Claiborne Billings  ----- Message ----- From: Laureen Abrahams, RN Sent: 08/02/2017  12:03 PM To: Gaspar Bidding, Milda Smart, # Subject: RE: lab bill info                              I am following up with the patient per her call - she states she has not received any calls regarding her concern.  To summarize her concern -  She had a lab drawn on 05/08/2017 for JAK-2 testing due to her elevated platelet count. This test was sent out from our office to [facility].  In September the patient received an EOB stating the cost of the test was greater then $5000 and presently insurance was only covering $2000 and she may be responsible for the remainder of cost.  On her EOB it states " need additional clinical data for further processing "  She called the [facility] billing and they could not give her any update because this test was a " send out ".  She contacted her insurance carrier and was informed need for additional clinical data is needed for processing.  She has now called to this office 4 times and has not been able to verify if the clinical data has been sent.  I personally have contacted [place]- and informed " her bill is still in processing "  I have also attempted to contact her insurance carrier twice- both times I had to make multiple calls due to being disconnected as well as both times I was on hold greater then 30 minutes when I did get connected. Due to my clinical duties I had to disconnect from the calls.  So I am hoping someone can help in verifying current status of request per her insurance.  Garnell can be reached at above number. ----- Message ----- From: Paulla Dolly, RN Sent: 07/27/2017   9:30  AM To: Laureen Abrahams, RN, Milda Smart Subject: lab bill [employer], Following up with you regarding this patient's concerns over her bill. Thanks for any help you can offer! Elmyra Ricks

## 2017-08-14 NOTE — Telephone Encounter (Signed)
-----   Message from Gaspar Bidding sent at 08/02/2017  5:15 PM EDT ----- Regarding: RE: lab bill info Contact: [phone] Yvonne Kendall,  We checked  On 05/08/2017 if auth needed : Per Altha Harm no auth needed for [employer] [phone]. Ref# [id number].  Darlena ----- Message ----- From: Laureen Abrahams, RN Sent: 08/02/2017  12:03 PM To: Gaspar Bidding, Milda Smart, # Subject: RE: lab bill info                              I am following up with the patient per her call - she states she has not received any calls regarding her concern.  To summarize her concern -  She had a lab drawn on 05/08/2017 for JAK-2 testing due to her elevated platelet count. This test was sent out from our office to [facility].  In September the patient received an EOB stating the cost of the test was greater then $5000 and presently insurance was only covering $2000 and she may be responsible for the remainder of cost.  On her EOB it states " need additional clinical data for further processing "  She called the [facility] billing and they could not give her any update because this test was a " send out ".  She contacted her insurance carrier and was informed need for additional clinical data is needed for processing.  She has now called to this office 4 times and has not been able to verify if the clinical data has been sent.  I personally have contacted [place]- and informed " her bill is still in processing "  I have also attempted to contact her insurance carrier twice- both times I had to make multiple calls due to being disconnected as well as both times I was on hold greater then 30 minutes when I did get connected. Due to my clinical duties I had to disconnect from the calls.  So I am hoping someone can help in verifying current status of request per her insurance.  Morning can be reached at above number. ----- Message ----- From: Paulla Dolly, RN Sent: 07/27/2017   9:30 AM To: Laureen Abrahams, RN, Milda Smart Subject: lab bill [employer], Following up with you regarding this patient's concerns over her bill. Thanks for any help you can offer! Elmyra Ricks

## 2017-08-15 ENCOUNTER — Encounter: Payer: Self-pay | Admitting: Oncology

## 2017-08-15 NOTE — Progress Notes (Signed)
I contacted [employer]  [phone] to obtain fax number. Per Ranae Pila B the fax number to submit clinical is [phone]. Called Val( Nurse) to provide fax information, per Val she already faxed the information needed.

## 2017-08-22 ENCOUNTER — Encounter: Payer: Self-pay | Admitting: Oncology

## 2017-08-24 ENCOUNTER — Encounter: Payer: Self-pay | Admitting: Oncology

## 2017-08-26 ENCOUNTER — Other Ambulatory Visit: Payer: Self-pay | Admitting: Oncology

## 2017-08-26 ENCOUNTER — Encounter: Payer: Self-pay | Admitting: Oncology

## 2017-09-13 ENCOUNTER — Encounter: Payer: BLUE CROSS/BLUE SHIELD | Admitting: Internal Medicine

## 2017-10-30 ENCOUNTER — Encounter: Payer: BLUE CROSS/BLUE SHIELD | Admitting: Internal Medicine

## 2017-10-31 ENCOUNTER — Encounter: Payer: Self-pay | Admitting: Family Medicine

## 2017-11-08 ENCOUNTER — Telehealth: Payer: Self-pay | Admitting: *Deleted

## 2017-11-08 ENCOUNTER — Other Ambulatory Visit: Payer: Self-pay | Admitting: *Deleted

## 2017-11-08 NOTE — Telephone Encounter (Signed)
This RN received message from pt per VM - [name] states she is scheduled for lab on 2/18- and due to prior incident of lab test drawn that she did not know of cost ( $5000) she wants to know what labs are ordered and who will be the resulting agent ( the prior test was a send out and primary issue was communication due to test not an in house test ).  [name] is requesting " a call for predetermination of benefits so I am informed and can follow up with my insurance company "  Noted per lab orders - pt has ordered CBC and C reactive.  This message will be forwarded to [employer] and Delrae Alfred for above information.  Will also send to MD for his review of call.

## 2017-11-14 ENCOUNTER — Other Ambulatory Visit: Payer: BLUE CROSS/BLUE SHIELD

## 2017-11-15 ENCOUNTER — Telehealth: Payer: Self-pay

## 2017-11-15 NOTE — Telephone Encounter (Signed)
Attempted to return pt call, no vm. Regarding her labs being covered by her insurance. [name] messaged me today 11/15/17 and said [name] is currently working on this.  Cyndia Bent RN

## 2017-11-27 ENCOUNTER — Other Ambulatory Visit: Payer: BLUE CROSS/BLUE SHIELD

## 2017-11-27 ENCOUNTER — Inpatient Hospital Stay: Payer: BLUE CROSS/BLUE SHIELD | Attending: Oncology

## 2017-11-27 DIAGNOSIS — D75839 Thrombocytosis, unspecified: Secondary | ICD-10-CM

## 2017-11-27 DIAGNOSIS — D473 Essential (hemorrhagic) thrombocythemia: Secondary | ICD-10-CM | POA: Diagnosis not present

## 2017-11-27 LAB — CBC WITH DIFFERENTIAL/PLATELET
BASOS PCT: 1 %
Basophils Absolute: 0.1 10*3/uL (ref 0.0–0.1)
EOS ABS: 0.1 10*3/uL (ref 0.0–0.5)
Eosinophils Relative: 2 %
HEMATOCRIT: 38.9 % (ref 34.8–46.6)
HEMOGLOBIN: 12.3 g/dL (ref 11.6–15.9)
Lymphocytes Relative: 28 %
Lymphs Abs: 1.6 10*3/uL (ref 0.9–3.3)
MCH: 29.4 pg (ref 25.1–34.0)
MCHC: 31.6 g/dL (ref 31.5–36.0)
MCV: 92.8 fL (ref 79.5–101.0)
MONOS PCT: 8 %
Monocytes Absolute: 0.5 10*3/uL (ref 0.1–0.9)
NEUTROS ABS: 3.6 10*3/uL (ref 1.5–6.5)
NEUTROS PCT: 61 %
Platelets: 549 10*3/uL — ABNORMAL HIGH (ref 145–400)
RBC: 4.19 MIL/uL (ref 3.70–5.45)
RDW: 13.5 % (ref 11.2–14.5)
WBC: 5.9 10*3/uL (ref 3.9–10.3)

## 2017-11-27 LAB — C-REACTIVE PROTEIN: CRP: <0.8 mg/dL (ref <1.0)

## 2017-12-27 ENCOUNTER — Encounter: Payer: Self-pay | Admitting: Gastroenterology

## 2018-01-15 ENCOUNTER — Other Ambulatory Visit: Payer: Self-pay

## 2018-01-15 ENCOUNTER — Ambulatory Visit (AMBULATORY_SURGERY_CENTER): Payer: Self-pay | Admitting: *Deleted

## 2018-01-15 VITALS — Ht 65.0 in | Wt 173.0 lb

## 2018-01-15 DIAGNOSIS — R5381 Other malaise: Secondary | ICD-10-CM | POA: Insufficient documentation

## 2018-01-15 DIAGNOSIS — Z1211 Encounter for screening for malignant neoplasm of colon: Secondary | ICD-10-CM

## 2018-01-15 DIAGNOSIS — R5383 Other fatigue: Secondary | ICD-10-CM | POA: Insufficient documentation

## 2018-01-15 DIAGNOSIS — E7800 Pure hypercholesterolemia, unspecified: Secondary | ICD-10-CM | POA: Insufficient documentation

## 2018-01-15 MED ORDER — NA SULFATE-K SULFATE-MG SULF 17.5-3.13-1.6 GM/177ML PO SOLN: 1.0000 | Freq: Once | ORAL | 0 refills | Status: AC

## 2018-01-15 NOTE — Progress Notes (Signed)
Patient denies any allergies to eggs or soy. Patient states hard to wake up with anesthesia. Patient denies any oxygen use at home. Patient denies taking any diet/weight loss medications or blood thinners. EMMI education assisgned to patient on colonoscopy, this was explained and instructions given to patient. suprep coupon given to pt in pv.

## 2018-01-16 ENCOUNTER — Encounter: Payer: Self-pay | Admitting: Gastroenterology

## 2018-01-25 ENCOUNTER — Ambulatory Visit (AMBULATORY_SURGERY_CENTER): Payer: BLUE CROSS/BLUE SHIELD | Admitting: Gastroenterology

## 2018-01-25 ENCOUNTER — Encounter: Payer: Self-pay | Admitting: Gastroenterology

## 2018-01-25 ENCOUNTER — Other Ambulatory Visit: Payer: Self-pay

## 2018-01-25 VITALS — BP 118/72 | HR 95 | Temp 97.1°F | Resp 18 | Ht 65.0 in | Wt 173.0 lb

## 2018-01-25 DIAGNOSIS — Z1211 Encounter for screening for malignant neoplasm of colon: Secondary | ICD-10-CM

## 2018-01-25 DIAGNOSIS — Z1212 Encounter for screening for malignant neoplasm of rectum: Secondary | ICD-10-CM

## 2018-01-25 MED ORDER — SODIUM CHLORIDE 0.9 % IV SOLN: 500.0000 mL | Freq: Once | INTRAVENOUS | Status: AC

## 2018-01-25 NOTE — Progress Notes (Signed)
A and O x3. Report to RN. Tolerated MAC anesthesia well.

## 2018-01-25 NOTE — Progress Notes (Signed)
Pt's states no medical or surgical changes since previsit or office visit. 

## 2018-01-25 NOTE — Op Note (Signed)
[place] Patient Name: [name] Procedure Date: 01/25/2018 9:21 AM MRN: [id number] Endoscopist: Mauri Pole , MD Age: 52 Referring MD:  Date of Birth: Mar 01, 1966 Gender: Female Account #: [id number] Procedure:                Colonoscopy Indications:              Screening for colorectal malignant neoplasm, This                            is the patient's first colonoscopy Medicines:                Monitored Anesthesia Care Procedure:                Pre-Anesthesia Assessment:                           - Prior to the procedure, a History and Physical                            was performed, and patient medications and                            allergies were reviewed. The patient's tolerance of                            previous anesthesia was also reviewed. The risks                            and benefits of the procedure and the sedation                            options and risks were discussed with the patient.                            All questions were answered, and informed consent                            was obtained. Prior Anticoagulants: The patient has                            taken no previous anticoagulant or antiplatelet                            agents. ASA Grade Assessment: II - A patient with                            mild systemic disease. After reviewing the risks                            and benefits, the patient was deemed in                            satisfactory condition to undergo the procedure.  After obtaining informed consent, the colonoscope                            was passed under direct vision. Throughout the                            procedure, the patient's blood pressure, pulse, and                            oxygen saturations were monitored continuously. The                            Colonoscope was introduced through the anus and                            advanced to the the  terminal ileum, with                            identification of the appendiceal orifice and IC                            valve. The colonoscopy was performed without                            difficulty. The patient tolerated the procedure                            well. The quality of the bowel preparation was                            excellent. The terminal ileum, ileocecal valve,                            appendiceal orifice, and rectum were photographed. Scope In: 9:40:53 AM Scope Out: 9:49:13 AM Scope Withdrawal Time: 0 hours 6 minutes 16 seconds  Total Procedure Duration: 0 hours 8 minutes 20 seconds  Findings:                 The perianal and digital rectal examinations were                            normal.                           A few small and large-mouthed diverticula were                            found in the sigmoid colon. There was evidence of                            an impacted diverticulum.                           Non-bleeding internal hemorrhoids were found during  retroflexion. The hemorrhoids were small.                           The exam was otherwise without abnormality. Complications:            No immediate complications. Estimated Blood Loss:     Estimated blood loss: none. Impression:               - Mild diverticulosis in the sigmoid colon. There                            was evidence of an impacted diverticulum.                           - Non-bleeding internal hemorrhoids.                           - The examination was otherwise normal.                           - No specimens collected. Recommendation:           - Patient has a contact number available for                            emergencies. The signs and symptoms of potential                            delayed complications were discussed with the                            patient. Return to normal activities tomorrow.                            Written  discharge instructions were provided to the                            patient.                           - Resume previous diet.                           - Continue present medications.                           - Repeat colonoscopy in 10 years for screening                            purposes. Mauri Pole, MD 01/25/2018 9:58:25 AM This report has been signed electronically.

## 2018-01-25 NOTE — Patient Instructions (Signed)
Information on diverticulosis and hemorrhoids given  YOU HAD AN ENDOSCOPIC PROCEDURE TODAY AT [facility]:   Refer to the procedure report that was given to you for any specific questions about what was found during the examination.  If the procedure report does not answer your questions, please call your gastroenterologist to clarify.  If you requested that your care partner not be given the details of your procedure findings, then the procedure report has been included in a sealed envelope for you to review at your convenience later.  YOU SHOULD EXPECT: Some feelings of bloating in the abdomen. Passage of more gas than usual.  Walking can help get rid of the air that was put into your GI tract during the procedure and reduce the bloating. If you had a lower endoscopy (such as a colonoscopy or flexible sigmoidoscopy) you may notice spotting of blood in your stool or on the toilet paper. If you underwent a bowel prep for your procedure, you may not have a normal bowel movement for a few days.  Please Note:  You might notice some irritation and congestion in your nose or some drainage.  This is from the oxygen used during your procedure.  There is no need for concern and it should clear up in a day or so.  SYMPTOMS TO REPORT IMMEDIATELY:   Following lower endoscopy (colonoscopy or flexible sigmoidoscopy):  Excessive amounts of blood in the stool  Significant tenderness or worsening of abdominal pains  Swelling of the abdomen that is new, acute  Fever of 100F or higher  For urgent or emergent issues, a gastroenterologist can be reached at any hour by calling [phone].   DIET:  We do recommend a small meal at first, but then you may proceed to your regular diet.  Drink plenty of fluids but you should avoid alcoholic beverages for 24 hours.  ACTIVITY:  You should plan to take it easy for the rest of today and you should NOT DRIVE or use heavy machinery until tomorrow  (because of the sedation medicines used during the test).    FOLLOW UP: Our staff will call the number listed on your records the next business day following your procedure to check on you and address any questions or concerns that you may have regarding the information given to you following your procedure. If we do not reach you, we will leave a message.  However, if you are feeling well and you are not experiencing any problems, there is no need to return our call.  We will assume that you have returned to your regular daily activities without incident.  If any biopsies were taken you will be contacted by phone or by letter within the next 1-3 weeks.  Please call us at [phone] if you have not heard about the biopsies in 3 weeks.    SIGNATURES/CONFIDENTIALITY: You and/or your care partner have signed paperwork which will be entered into your electronic medical record.  These signatures attest to the fact that that the information above on your After Visit Summary has been reviewed and is understood.  Full responsibility of the confidentiality of this discharge information lies with you and/or your care-partner.

## 2018-01-29 ENCOUNTER — Telehealth: Payer: Self-pay

## 2018-01-29 NOTE — Telephone Encounter (Signed)
  Follow up Call-  Call back number 01/25/2018  Post procedure Call Back phone  # [phone]  Permission to leave phone message Yes  Some recent data might be hidden     Patient questions:  Do you have a fever, pain , or abdominal swelling? No. Pain Score  0 *  Have you tolerated food without any problems? Yes.    Have you been able to return to your normal activities? Yes.    Do you have any questions about your discharge instructions: Diet   No. Medications  No. Follow up visit  No.  Do you have questions or concerns about your Care? Yes.    Actions: * If pain score is 4 or above: No action needed, pain <4.  Pt had questions about diverticulosis.  I explained what diverticulosis was and I asked pt to check and make sure she received a green handout with info on diverticulosis and diverticulitis.  Pt said her papers where in her car.  She will check and let me know if she needs info sheet.  I told her I could mail to her info if needed.  Pt will call back if she needs the info mailed to her home. maw

## 2018-03-09 NOTE — Progress Notes (Signed)
[place]  [phone] [phone]     ID: [name] DOB: 13-Jan-1966  MR#: [id number]  PNT#:[id number]  Patient Care Team: Katherina Mires, MD as PCP - General (Family Medicine) Ladeja Pelham, Virgie Dad, MD as Consulting Physician (Oncology) Jerelyn Charles, MD as Consulting Physician (Obstetrics) PCP: Katherina Mires, MD OTHER MD:  CHIEF COMPLAINT: Thrombocytosis  CURRENT TREATMENT: Observation  RESEARCH PROTOCOL: None  INTERVAL HISTORY: [name] returns today for follow up of her thrombocytosis. She continues under observation. She reports that she hasn't has any issues with thyromegaly within the last year. She continues to take iron supplements. She reports that her last period was in January 2019. The one before that was in December/ November 2018. She follows up with her PCP, Dr. Suzanna Obey. Once per year around the month of September. She also takes multi-vitamins.   REVIEW OF SYSTEMS: [name] reports that she walks and climbs the stairs at work. She denies unusual headaches, visual changes, nausea, vomiting, or dizziness. There has been no unusual cough, phlegm production, or pleurisy. This been no change in bowel or bladder habits. She denies unexplained fatigue or unexplained weight loss, bleeding, rash, or fever. A detailed review of systems was otherwise stable.   HISTORY OF PRESENT ILLNESS. From the original intake note:  [name] has a history of thrombocytosis dating back to 2012. As seen below, the platelet count has been consistently above 400,000 but never as high as 450,000.  Results for Diane, Bowers (MRN [id number]) as of 05/16/2017 07:49  Ref. Range 04/12/2011 12:52 08/02/2011 08:06 11/29/2011 08:53 04/26/2012 19:00 04/27/2012 05:35 08/02/2012 08:58 05/08/2017 08:04  Platelets Latest Ref Range: 145 - 400 10e3/uL 456 (H) 474 (H) 500 (H) 474 (H) 415 (H) 486 (H) 529 (H)   I saw her in 2012 for evaluation of this problem. At that time we  obtained a set of labs including a blood film review, which was significant only for some large platelets, but no morphologic changes in the white or red cells. LDH was normal at 138, but the sedimentation rate was mildly elevated at 25, with 22 being the upper normal in this lab. C reactive protein was also mildly elevated at 0.7. A set of cancer antigens were sent all normal and a ferritin was obtained which was slightly low at 32, repeated a year later at 52.  The patient received a diagnosis of thrombocytosis, not otherwise specified, and was lost to follow-up here. More recently she has established herself with Dr. Derrek Monaco who noted an increase in the platelet count and referred her back for further evaluation.    Allergies  Allergen Reactions  . Ginger Hives  . Other Nausea And Vomiting    Darvocet-N    Current Outpatient Medications  Medication Sig Dispense Refill  . Ferrous Sulfate (IRON HIGH-POTENCY) 325 MG TABS Take 1 tablet by mouth 2 (two) times daily.    . fexofenadine (ALLEGRA) 180 MG tablet Take 1 tablet by mouth daily.    . Multiple Vitamins-Minerals (MULTIVITAMIN WITH MINERALS) tablet Take 1 tablet by mouth daily.     Current Facility-Administered Medications  Medication Dose Route Frequency Provider Last Rate Last Dose  . 0.9 %  sodium chloride infusion  500 mL Intravenous Once Nandigam, Venia Minks, MD        PAST MEDICAL HISTORY: Past Medical History:  Diagnosis Date  . Allergy   . Anemia   . History of palpitations couple of years ago   stress related  per pt  . Seasonal allergies     PAST SURGICAL HISTORY: Past Surgical History:  Procedure Laterality Date  . LAPAROSCOPY  12/06/2011   Procedure: LAPAROSCOPY OPERATIVE;  Surgeon: Olga Millers, MD;  Location: [facility];  Service: Gynecology;  Laterality: [name];  . OVARIAN CYST REMOVAL  12/06/2011   Procedure: OVARIAN CYSTECTOMY;  Surgeon: Olga Millers, MD;  Location: [facility];  Service: Gynecology;  Laterality:  Bilateral;  . THYROIDECTOMY  08/13/2012   Procedure: THYROIDECTOMY;  Surgeon: Jerrell Belfast, MD;  Location: [facility];  Service: ENT;  Laterality: Left;    FAMILY HISTORY Family History  Problem Relation Age of Onset  . Hypertension Mother   . Hypertension Father   . Hypertension Paternal Grandmother   . Colon cancer Neg Hx   . Esophageal cancer Neg Hx   . Stomach cancer Neg Hx   There is no family history of blood problems to the patient's knowledge. One nephew died at the age of 75 from cancer but the patient does not know what type  GYNECOLOGIC HISTORY:  Menarche age 55, she is GX P0. Her last menstrual period was in May. Her periods are now irregular, last for 5 days of which the first 2 days are heavy.  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 He has a child from a prior marriage.    ADVANCED DIRECTIVES: Not in place  HEALTH MAINTENANCE: Social History   Tobacco Use  . Smoking status: Never Smoker  . Smokeless tobacco: Never Used  Substance Use Topics  . Alcohol use: Never    Alcohol/week: 0.0 oz    Frequency: Never  . Drug use: Never     Colonoscopy: 01/25/2018  PAP: Up-to-date  Bone density:  Lipid panel:  OBJECTIVE: Young-appearing African-American woman in no acute distress  Vitals:   03/13/18 1515  BP: 110/77  Pulse: 96  Resp: 18  Temp: 98.7 F (37.1 C)  SpO2: 99%     Body mass index is 29.17 kg/m.    ECOG FS:0 - Asymptomatic  Sclerae unicteric, EOMs intact Oropharynx clear and moist No cervical or supraclavicular adenopathy Lungs no rales or rhonchi Heart regular rate and rhythm Abd soft, nontender, positive bowel sounds, no palpable splenomegaly MSK no focal spinal tenderness, no upper extremity lymphedema Neuro: nonfocal, well oriented, appropriate affect Breasts: Deferred  LAB RESULTS:  CMP     Component Value Date/Time   NA 139 05/08/2017 0804   K 3.9 05/08/2017 0804   CL 102 04/27/2012  0535   CO2 27 05/08/2017 0804   GLUCOSE 90 05/08/2017 0804   BUN 12.7 05/08/2017 0804   CREATININE 0.8 05/08/2017 0804   CALCIUM 9.6 05/08/2017 0804   PROT 7.3 05/08/2017 0804   ALBUMIN 3.7 05/08/2017 0804   AST 14 05/08/2017 0804   ALT 9 05/08/2017 0804   ALKPHOS 68 05/08/2017 0804   BILITOT 0.47 05/08/2017 0804   GFRNONAA >90 04/27/2012 0535   GFRAA >90 04/27/2012 0535    No results found for: KPAFRELGTCHN, LAMBDASER, KAPLAMBRATIO  No results found for: TOTALPROTELP, ALBUMINELP, A1GS, A2GS, BETS, BETA2SER, GAMS, MSPIKE, SPEI  Lab Results  Component Value Date   WBC 6.7 03/13/2018   NEUTROABS 4.2 03/13/2018   HGB 12.0 03/13/2018   HCT 37.3 03/13/2018   MCV 90.8 03/13/2018   PLT 537 (H) 03/13/2018      Chemistry      Component Value Date/Time   NA 139 05/08/2017 0804  K 3.9 05/08/2017 0804   CL 102 04/27/2012 0535   CO2 27 05/08/2017 0804   BUN 12.7 05/08/2017 0804   CREATININE 0.8 05/08/2017 0804      Component Value Date/Time   CALCIUM 9.6 05/08/2017 0804   ALKPHOS 68 05/08/2017 0804   AST 14 05/08/2017 0804   ALT 9 05/08/2017 0804   BILITOT 0.47 05/08/2017 0804       Lab Results  Component Value Date   LABCA2 5 04/12/2011    No components found for: GYJEH631  No results for input(s): INR in the last 168 hours.  Urinalysis    Component Value Date/Time   COLORURINE YELLOW 04/26/2012 1858   APPEARANCEUR CLEAR 04/26/2012 1858   LABSPEC 1.018 04/26/2012 1858   PHURINE 5.5 04/26/2012 1858   GLUCOSEU NEGATIVE 04/26/2012 1858   HGBUR TRACE (A) 04/26/2012 1858   BILIRUBINUR NEGATIVE 04/26/2012 1858   KETONESUR 40 (A) 04/26/2012 1858   PROTEINUR NEGATIVE 04/26/2012 1858   UROBILINOGEN 0.2 04/26/2012 1858   NITRITE NEGATIVE 04/26/2012 1858   LEUKOCYTESUR NEGATIVE 04/26/2012 1858    RADIOLOGY AND OTHER STUDIES: No results found.  ASSESSMENT: 52 y.o. [place] woman with a history of thrombocytosis going back at least to 2012, recently with a  somewhat higher platelet count  (1) JAK 2 results  Negative  (2) mild increase in ESR and C-reactive protein noted 2012, with normal ferritin and negative screening test for malignancy  (3) repeat workup August 2018 included a negative ANA, normal sedimentation rate at 12, an adequate ferritin at 99, in the setting of a high platelet count at 597, without any morphologic abnormalities on smear.  PLAN: [name]'s platelet count remains very stably in the 500 range.  She has had no bleeding or clotting complications.  We discussed the way normalcy is defined statistically, I drew her a normal distribution curve and I pointed out that 2 standard deviations includes 95% of a normal population.  This means 2-1/2% of that normal population will be above and 2-1/2% will be below the "normal" range.  In the case of platelets that means that 2 to 3% of Americans will have a platelet count greater than 400,002 to 3% will have a platelet count less than 150,000 and yet they will all be normal by definition  She very likely is 1 of those.  The concern of course is that if she does have primary thrombocytosis, even if mild, this could result in marrow fibrosis over a period of decades.  On the other hand if she really wanted me to absolutely rule out primary thrombocytosis we would have to do a bone marrow biopsy and I would want to obtain additional lab work for mutations.  Remember that we had a great deal of difficulty paying for the Jak 2 test that we did send, and she almost ended up with a several thousand dollars bill out of her own pocket.  Luckily that was taken care of but she really does not want to undergo a similar experience and she also is not interested in proceeding to bone marrow biopsy.  Accordingly at this point I am comfortable returning her to her primary care physician.  I will be glad to see her at any point in the future if and when the need arises or certainly if her platelet count  trends further up, or if any bleeding or clotting problems develop.  At this point however we are making no further follow-up appointments for her here  Kashmir Leedy, Virgie Dad, MD  03/13/18 3:28 PM Medical Oncology and Hematology [facility] [address] [place], [place] [address] Tel. [phone]    Fax. [phone]  Alice Rieger, am acting as scribe for Chauncey Cruel MD.  I, Lurline Del MD, have reviewed the above documentation for accuracy and completeness, and I agree with the above.

## 2018-03-13 ENCOUNTER — Inpatient Hospital Stay: Payer: BLUE CROSS/BLUE SHIELD | Attending: Oncology

## 2018-03-13 ENCOUNTER — Inpatient Hospital Stay (HOSPITAL_BASED_OUTPATIENT_CLINIC_OR_DEPARTMENT_OTHER): Payer: BLUE CROSS/BLUE SHIELD | Admitting: Oncology

## 2018-03-13 VITALS — BP 110/77 | HR 96 | Temp 98.7°F | Resp 18 | Ht 65.0 in | Wt 175.3 lb

## 2018-03-13 DIAGNOSIS — D649 Anemia, unspecified: Secondary | ICD-10-CM

## 2018-03-13 DIAGNOSIS — D473 Essential (hemorrhagic) thrombocythemia: Secondary | ICD-10-CM | POA: Insufficient documentation

## 2018-03-13 DIAGNOSIS — D75839 Thrombocytosis, unspecified: Secondary | ICD-10-CM

## 2018-03-13 LAB — CBC WITH DIFFERENTIAL (CANCER CENTER ONLY)
BASOS ABS: 0.1 10*3/uL (ref 0.0–0.1)
BASOS PCT: 1 %
EOS ABS: 0.1 10*3/uL (ref 0.0–0.5)
Eosinophils Relative: 1 %
HEMATOCRIT: 37.3 % (ref 34.8–46.6)
Hemoglobin: 12 g/dL (ref 11.6–15.9)
Lymphocytes Relative: 27 %
Lymphs Abs: 1.8 10*3/uL (ref 0.9–3.3)
MCH: 29.2 pg (ref 25.1–34.0)
MCHC: 32.2 g/dL (ref 31.5–36.0)
MCV: 90.8 fL (ref 79.5–101.0)
MONO ABS: 0.6 10*3/uL (ref 0.1–0.9)
MONOS PCT: 10 %
NEUTROS ABS: 4.2 10*3/uL (ref 1.5–6.5)
Neutrophils Relative %: 61 %
PLATELETS: 537 10*3/uL — AB (ref 145–400)
RBC: 4.11 MIL/uL (ref 3.70–5.45)
RDW: 13.8 % (ref 11.2–14.5)
WBC Count: 6.7 10*3/uL (ref 3.9–10.3)

## 2018-03-14 ENCOUNTER — Telehealth: Payer: Self-pay | Admitting: Oncology

## 2018-03-14 LAB — C-REACTIVE PROTEIN: CRP: <0.8 mg/dL (ref <1.0)

## 2018-03-14 NOTE — Telephone Encounter (Signed)
Per 6/4 no los °

## 2018-08-13 IMAGING — US US THYROID BIOPSY
1 series · 13 of 16 positions shown · non-contrast
Comparison: none

INDICATION: Patient with history of prior needle aspirate biopsies of left lobe
complex cystic nodule, isthmus nodule and left lower pole nodule in
January 2008 with negative pathology. Subsequently underwent left
Recent thyroid ultrasound at [HOSPITAL] on 12/29/2016 reveals a
dominant 2.8 cm complex cystic right and mid lower pole nodule.
Request now received for needle aspirate biopsy of this dominant
right complex cystic thyroid nodule.

[Series 1: us thyroid biopsy · 0.06mm/px · 16 acquisitions, 13 frames shown]
[im 1/16]
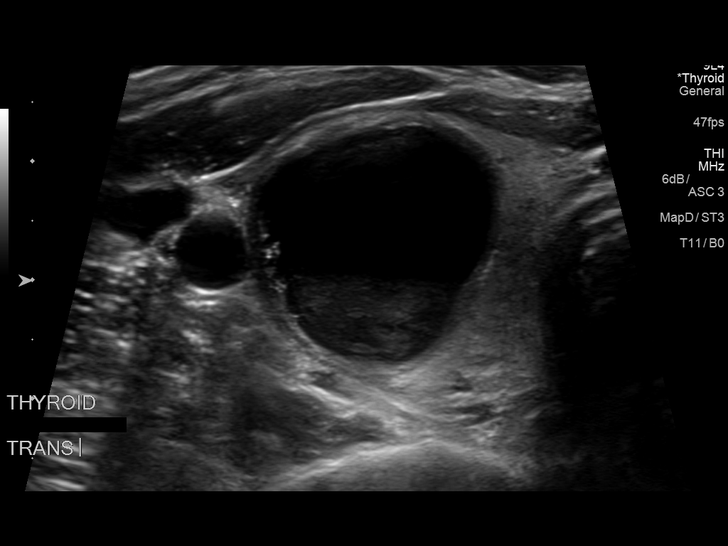
[im 2/16]
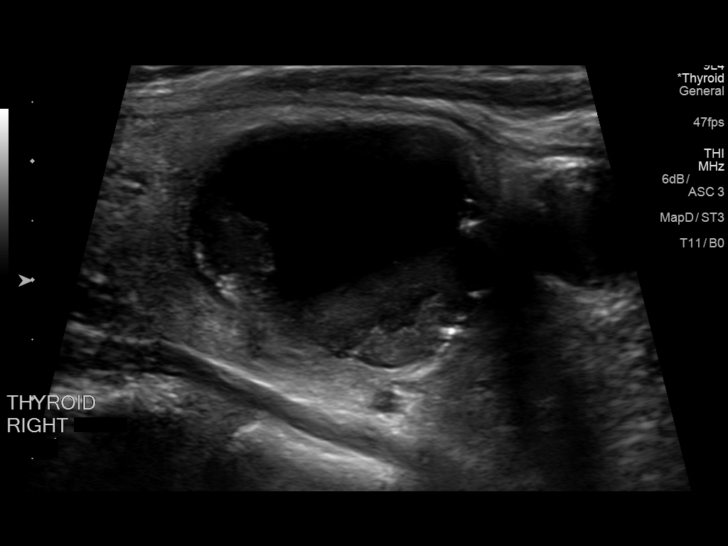
[im 4/16]
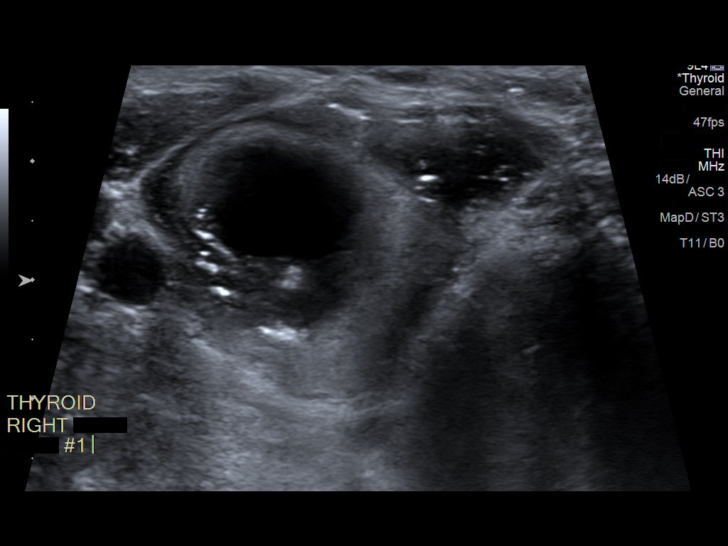
[im 5/16]
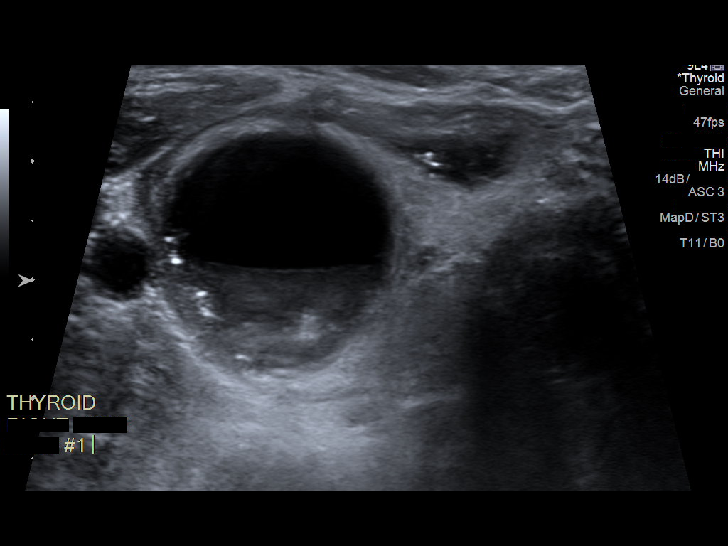
[im 6/16]
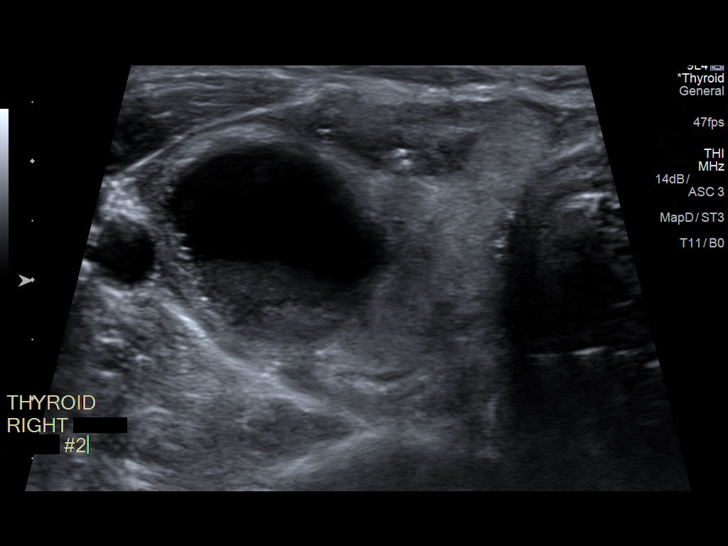
[im 7/16]
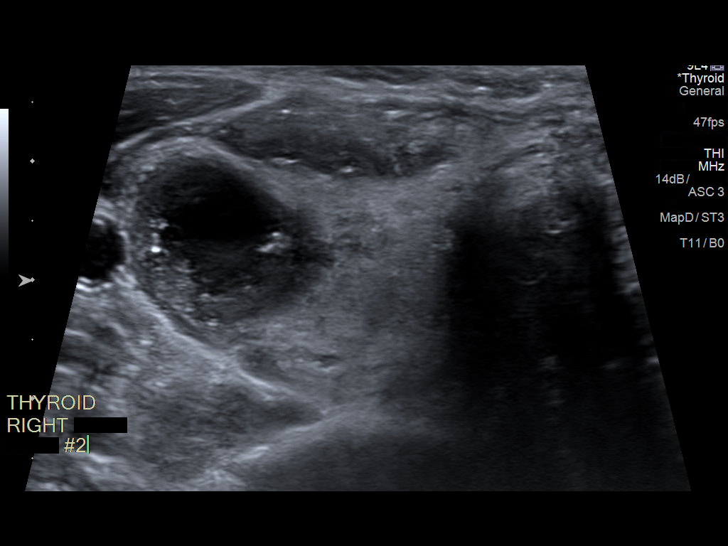
[im 9/16]
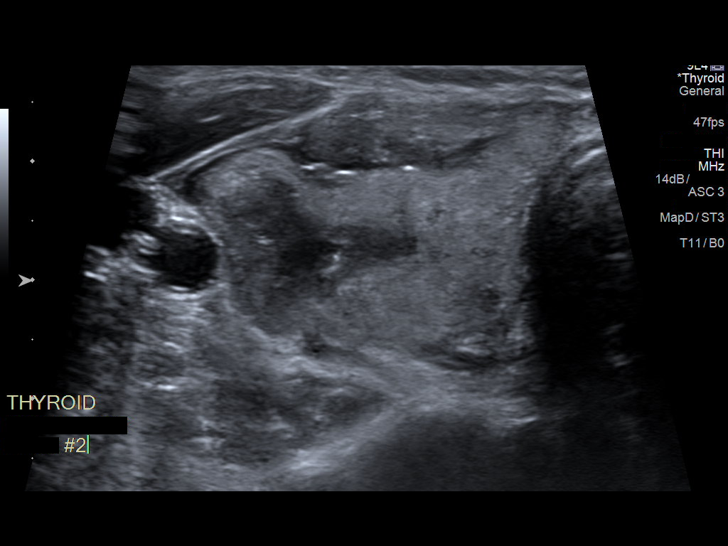
[im 10/16]
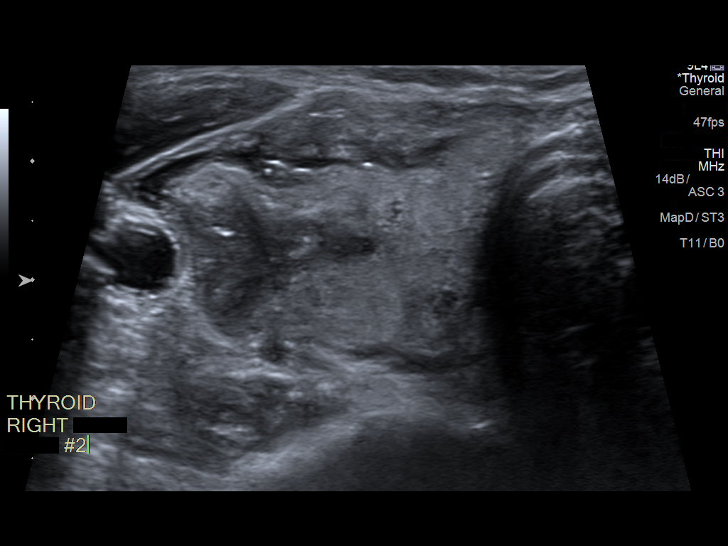
[im 11/16]
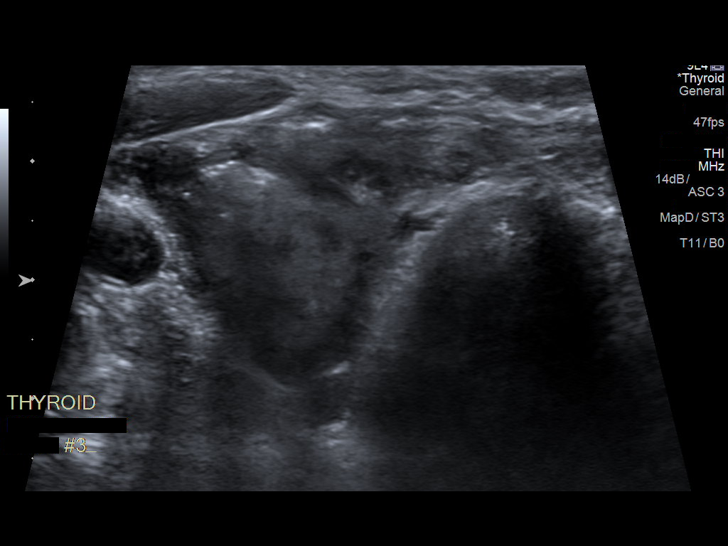
[im 12/16]
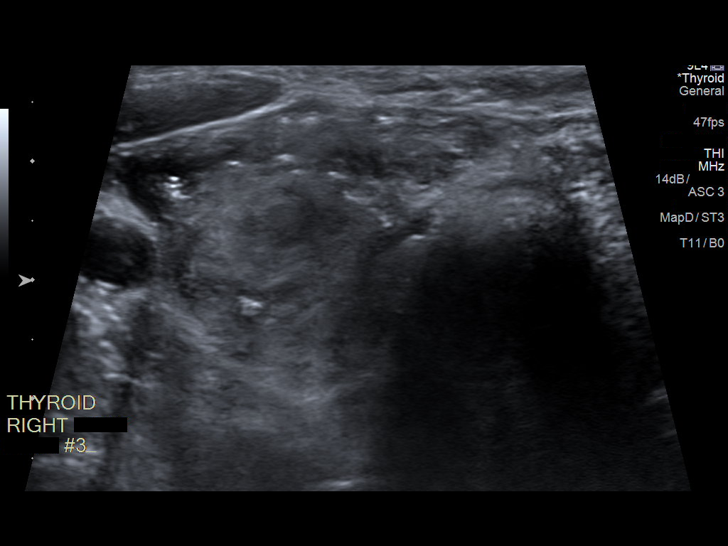
[im 13/16]
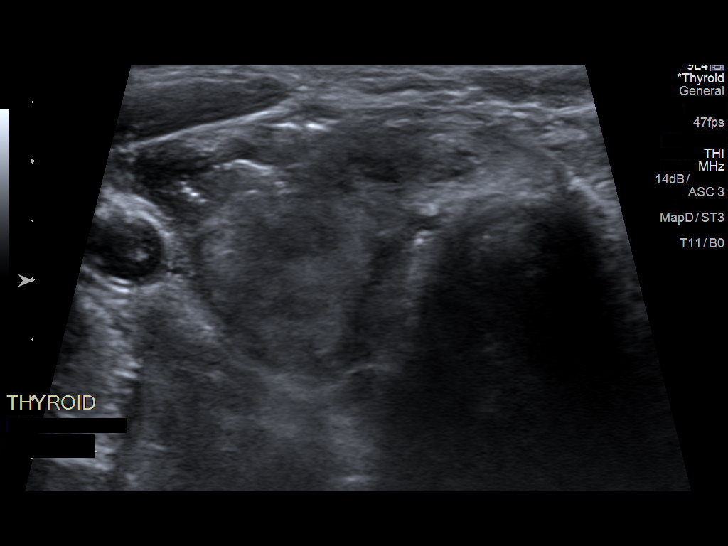
[im 15/16]
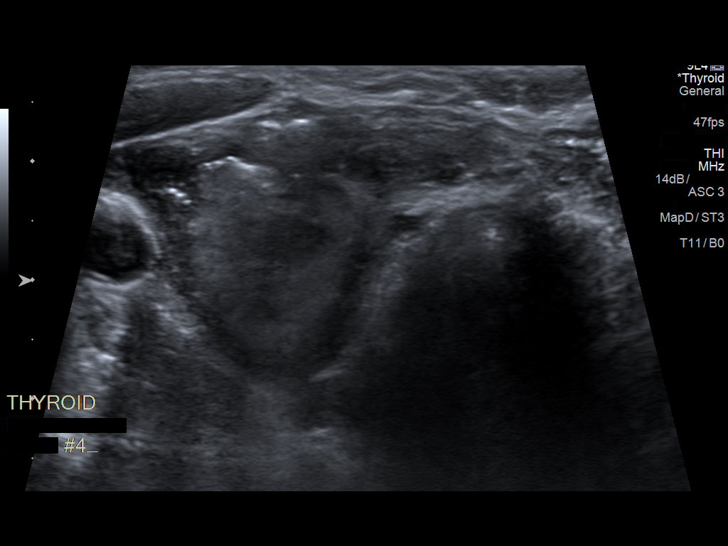
[im 16/16]
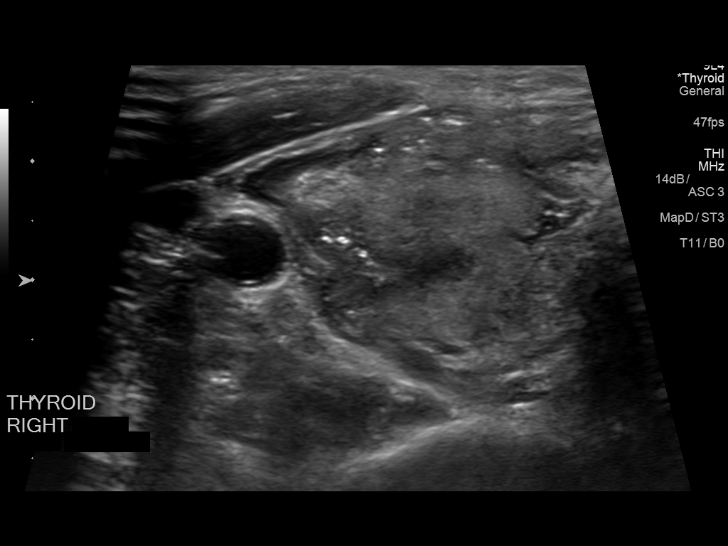

[13 of 16 positions shown; findings below may reference images not displayed]

EXAM:
ULTRASOUND GUIDED NEEDLE ASPIRATE BIOPSY OF DOMINANT COMPLEX CYSTIC
RIGHT MID AND LOWER POLE NODULE

MEDICATIONS:
None.

ANESTHESIA/SEDATION:
None

FLUOROSCOPY TIME:  None

COMPLICATIONS:
None immediate.

PROCEDURE:
Thyroid biopsy was thoroughly discussed with the patient and
questions were answered. The benefits, risks, alternatives, and
complications were also discussed. The patient understands and
wishes to proceed with the procedure. Written consent was obtained.

Ultrasound was performed to localize and mark an adequate site for
the biopsy. The patient was then prepped and draped in a normal
sterile fashion. Local anesthesia was provided with 1% lidocaine.
Using direct ultrasound guidance, 4 passes were made using 25 gauge
needles into the right mid and lower pole complex cystic thyroid
nodule. Ultrasound was used to confirm needle placements on all
occasions. Specimens were sent to Pathology for analysis.
Approximately 5 cc of brown fluid were aspirated from the cyst along
with sampling of peripheral solid component.
IMPRESSION: Ultrasound guided needle aspirate biopsy performed of a dominant
cm complex cystic right mid and lower pole nodule. Final pathology
pending.

## 2021-11-02 ENCOUNTER — Other Ambulatory Visit: Payer: Self-pay | Admitting: Orthopedic Surgery

## 2022-07-04 ENCOUNTER — Other Ambulatory Visit: Payer: Self-pay | Admitting: Obstetrics and Gynecology

## 2022-07-04 DIAGNOSIS — R928 Other abnormal and inconclusive findings on diagnostic imaging of breast: Secondary | ICD-10-CM

## 2022-07-08 ENCOUNTER — Ambulatory Visit
Admission: RE | Admit: 2022-07-08 | Discharge: 2022-07-08 | Disposition: A | Discharge status: Home / Self Care | Payer: BC Managed Care – PPO | Source: Ambulatory Visit | Attending: Obstetrics and Gynecology | Admitting: Obstetrics and Gynecology

## 2022-07-08 ENCOUNTER — Ambulatory Visit
Admission: RE | Admit: 2022-07-08 | Discharge: 2022-07-08 | Disposition: A | Discharge status: Home / Self Care | Payer: BLUE CROSS/BLUE SHIELD | Source: Ambulatory Visit | Attending: Obstetrics and Gynecology | Admitting: Obstetrics and Gynecology

## 2022-07-08 DIAGNOSIS — R928 Other abnormal and inconclusive findings on diagnostic imaging of breast: Secondary | ICD-10-CM

## 2023-06-02 ENCOUNTER — Ambulatory Visit (INDEPENDENT_AMBULATORY_CARE_PROVIDER_SITE_OTHER): Payer: BC Managed Care – PPO | Admitting: Podiatry

## 2023-06-02 ENCOUNTER — Encounter: Payer: Self-pay | Admitting: Podiatry

## 2023-06-02 DIAGNOSIS — B353 Tinea pedis: Secondary | ICD-10-CM

## 2023-06-02 DIAGNOSIS — B351 Tinea unguium: Secondary | ICD-10-CM

## 2023-06-02 MED ORDER — KETOCONAZOLE 2 % EX CREA: 1.0000 | TOPICAL_CREAM | Freq: Every day | CUTANEOUS | 0 refills | Status: DC

## 2023-06-02 MED ORDER — CICLOPIROX 8 % EX SOLN: Freq: Every day | CUTANEOUS | 0 refills | Status: DC

## 2023-06-02 NOTE — Progress Notes (Signed)
  Subjective:  Patient ID: [name], female    DOB: Aug 10, 1966,  MRN: [id number]  Chief Complaint  Patient presents with   Toe Pain    RM20: patient is here for bilateral foot skin peeling and flakiness     57 y.o. female presents patient presents for dry flaking skin on the bottom of both feet and sides of feet.  She states she does have some itching on the bottom of her feet.  Also notes some discoloration of the nails.  Past Medical History:  Diagnosis Date   Allergy    Anemia    History of palpitations couple of years ago   stress related  per pt   Seasonal allergies     Allergies  Allergen Reactions   Ginger Hives   Other Nausea And Vomiting    Darvocet-N    ROS: Negative except as per HPI above  Objective:  General: AAO x3, NAD  Dermatological: Discoloration of the nails with yellowing and black discoloration.  No significant dystrophy.  No pain to the nails.  There is dry flaking skin that rash present on the plantar aspect of both feet.  Vascular:  Dorsalis Pedis artery and Posterior Tibial artery pedal pulses are 2/4 bilateral.  Capillary fill time < 3 sec to all digits.   Neruologic: Grossly intact via light touch bilateral. Protective threshold intact to all sites bilateral.   Musculoskeletal: No gross boney pedal deformities bilateral. No pain, crepitus, or limitation noted with foot and ankle range of motion bilateral. Muscular strength 5/5 in all groups tested bilateral.  Gait: Unassisted, Nonantalgic.   No images are attached to the encounter.  Assessment:   1. Tinea pedis of both feet   2. Onychomycosis      Plan:  Patient was evaluated and treated and all questions answered.  Discussed the etiology and treatment options for tinea pedis.  Discussed topical and oral treatment.  Recommended topical treatment with 2% ketoconazole cream.  This was sent to the patient's pharmacy.  Also discussed appropriate foot hygiene, use of antifungal spray  such as Tinactin in shoes, as well as cleaning her foot surfaces such as showers and bathroom floors with bleach.  Onychomycosis -Educated on etiology of nail fungus. -eRx for Penlac topical 8% solution apply once daily to all nails.   Return in about 6 weeks (around 07/14/2023) for f/u tinea pedis.          Corinna Gab, DPM [facility] / [facility]

## 2023-07-10 ENCOUNTER — Other Ambulatory Visit: Payer: Self-pay | Admitting: Podiatry

## 2023-07-10 MED ORDER — CICLOPIROX 8 % EX SOLN: Freq: Every day | CUTANEOUS | 0 refills | Status: DC

## 2023-07-10 MED ORDER — KETOCONAZOLE 2 % EX CREA: 1.0000 | TOPICAL_CREAM | Freq: Every day | CUTANEOUS | 0 refills | Status: DC

## 2023-07-10 NOTE — Progress Notes (Signed)
Refill sent per pt request.  

## 2023-07-14 ENCOUNTER — Ambulatory Visit: Payer: BC Managed Care – PPO | Admitting: Podiatry

## 2023-08-25 ENCOUNTER — Other Ambulatory Visit: Payer: Self-pay

## 2023-08-25 MED ORDER — KETOCONAZOLE 2 % EX CREA: 1.0000 | TOPICAL_CREAM | Freq: Every day | CUTANEOUS | 0 refills | Status: DC

## 2023-09-05 ENCOUNTER — Other Ambulatory Visit: Payer: Self-pay | Admitting: Podiatry

## 2023-09-05 MED ORDER — KETOCONAZOLE 2 % EX CREA: 1.0000 | TOPICAL_CREAM | Freq: Every day | CUTANEOUS | 3 refills | Status: DC

## 2023-09-05 NOTE — Progress Notes (Signed)
Refill of ketoconazole cream sent per pt request

## 2023-09-28 ENCOUNTER — Ambulatory Visit: Payer: BC Managed Care – PPO | Admitting: Podiatry

## 2023-10-05 ENCOUNTER — Ambulatory Visit: Payer: BC Managed Care – PPO | Admitting: Podiatry

## 2023-10-06 ENCOUNTER — Ambulatory Visit (INDEPENDENT_AMBULATORY_CARE_PROVIDER_SITE_OTHER): Payer: BC Managed Care – PPO | Admitting: Podiatry

## 2023-10-06 ENCOUNTER — Encounter: Payer: Self-pay | Admitting: Podiatry

## 2023-10-06 DIAGNOSIS — B353 Tinea pedis: Secondary | ICD-10-CM | POA: Diagnosis not present

## 2023-10-06 DIAGNOSIS — B351 Tinea unguium: Secondary | ICD-10-CM

## 2023-10-06 MED ORDER — CICLOPIROX 8 % EX SOLN: Freq: Every day | CUTANEOUS | 3 refills | Status: AC

## 2023-10-06 NOTE — Progress Notes (Unsigned)
°  Subjective:  Patient ID: [name], female    DOB: [birth year],  MRN: [id number]  Chief Complaint  Patient presents with   Callouses    She is here for a follow up of the dry skin mostly on the bottom of her feet. She was using the keratocele that was prescribed before and it seemed to help a little but not helping much there. She is using Eucerine cream and it helps a little.     57 y.o. female presents patient presents as follow up for dry flaking skin on the bottom of both feet and sides of feet.  She states that this has improved significantly since last visit and with use of topical ketoconazole cream.  She does state that it is not completely resolved however.  Has also been using Penlac for fungal toenails.  Past Medical History:  Diagnosis Date   Allergy    Anemia    History of palpitations couple of years ago   stress related  per pt   Seasonal allergies     Allergies  Allergen Reactions   Ginger Hives   Other Nausea And Vomiting    Darvocet-N   Estradiol Hives    ROS: Negative except as per HPI above  Objective:  General: AAO x3, NAD  Dermatological: Discoloration of the nails with yellowing and black discoloration.  No significant dystrophy.  No pain to the nails. Mild improvement here.  Mild dry flaky skin to bilateral pedal surfaces. No significant rash present at this point.  Vascular:  Dorsalis Pedis artery and Posterior Tibial artery pedal pulses are 2/4 bilateral.  Capillary fill time < 3 sec to all digits.   Neruologic: Grossly intact via light touch bilateral. Protective threshold intact to all sites bilateral.   Musculoskeletal: No gross boney pedal deformities bilateral. No pain, crepitus, or limitation noted with foot and ankle range of motion bilateral. Muscular strength 5/5 in all groups tested bilateral.  Gait: Unassisted, Nonantalgic.   No images are attached to the encounter.  Assessment:   1. Tinea pedis of both feet   2. Onychomycosis       Plan:  Patient was evaluated and treated and all questions answered.  Discussed the etiology and treatment options for tinea pedis.  She has had improvement the appearance of her feet.  Discussed topical and oral treatment.  Recommended continuing with treatment with 2% ketoconazole cream for another week. Also discussed appropriate foot hygiene, use of antifungal spray such as Tinactin in shoes, as well as cleaning her foot surfaces such as showers and bathroom floors with bleach. Advised patient to switch to over the counter urea cream after completion of the topical ketoconazole to help hydrate her feet.  Onychomycosis -Educated on etiology of nail fungus. -eRx for Penlac topical 8% solution apply once daily to all nails. -Instructed patient to remove once a week with nail polish remover or rubbing alcohol.   Return in about 3 months (around 01/04/2024) for onychomycosis/tinea pedis.    Bronwen Betters, DPM [facility] / [facility]

## 2024-01-05 ENCOUNTER — Ambulatory Visit: Payer: BC Managed Care – PPO | Admitting: Podiatry

## 2024-01-12 ENCOUNTER — Encounter: Payer: Self-pay | Admitting: Podiatry

## 2024-01-12 ENCOUNTER — Ambulatory Visit (INDEPENDENT_AMBULATORY_CARE_PROVIDER_SITE_OTHER): Admitting: Podiatry

## 2024-01-12 DIAGNOSIS — B353 Tinea pedis: Secondary | ICD-10-CM | POA: Diagnosis not present

## 2024-01-12 DIAGNOSIS — B351 Tinea unguium: Secondary | ICD-10-CM | POA: Diagnosis not present

## 2024-01-12 MED ORDER — KETOCONAZOLE 2 % EX CREA: 1.0000 | TOPICAL_CREAM | Freq: Every day | CUTANEOUS | 0 refills | Status: AC

## 2024-01-12 MED ORDER — CICLOPIROX 8 % EX SOLN: Freq: Every day | CUTANEOUS | 3 refills | Status: AC

## 2024-01-12 NOTE — Progress Notes (Signed)
  Subjective:  Patient ID: [name], female    DOB: 1966/08/05,  MRN: [id number]  Chief Complaint  Patient presents with   Tinea Pedis    "I still have some flakiness going on.  I been using the Urea cream but it was leaving a chalky look on my foot."    58 y.o. female presents patient presents as follow up for dry flaking skin on the bottom of both feet.  She is also been using Penlac for fungal toenails.  She reports some improvement to the affected toenails.  Regarding the dry skin, she reports that the ketoconazole cream helped however this returned upon discontinuing the cream and despite use of continued moisturization.  Past Medical History:  Diagnosis Date   Allergy    Anemia    History of palpitations couple of years ago   stress related  per pt   Seasonal allergies     Allergies  Allergen Reactions   Ginger Hives   Other Nausea And Vomiting    Darvocet-N   Estradiol Hives    ROS: Negative except as per HPI above  Objective:  General: AAO x3, NAD  Dermatological: Some improvement to the affected toenails noted with 50% nail clearance.  There are some peeling skin to the plantar surfaces of the feet bilaterally  Vascular:  Dorsalis Pedis artery and Posterior Tibial artery pedal pulses are 2/4 bilateral.  Capillary fill time < 3 sec to all digits.   Neruologic: Grossly intact via light touch bilateral. Protective threshold intact to all sites bilateral.   Musculoskeletal: No gross boney pedal deformities bilateral. No pain, crepitus, or limitation noted with foot and ankle range of motion bilateral. Muscular strength 5/5 in all groups tested bilateral.  Gait: Unassisted, Nonantalgic.   No images are attached to the encounter.  Assessment:   1. Onychomycosis   2. Tinea pedis of both feet      Plan:  Patient was evaluated and treated and all questions answered.  Discussed the etiology and treatment options for tinea pedis.  Prescription for 2%  ketoconazole cream to be applied once a day for 3 weeks sent into patient's pharmacy.  Also discussed appropriate foot hygiene, use of antifungal spray such as Tinactin in shoes, as well as cleaning her foot surfaces such as showers and bathroom floors with bleach.  Did advise that she return to clinic if she does not notice resolution following use of the ketoconazole.  Onychomycosis -Educated on etiology of nail fungus. -eRx for Penlac topical 8% solution apply once daily to all nails. -Instructed patient to remove once a week with nail polish remover or rubbing alcohol.   Return in about 3 months (around 04/12/2024) for Onychomycosis.    Bronwen Betters, DPM [facility] / [facility]

## 2024-01-15 ENCOUNTER — Encounter: Payer: Self-pay | Admitting: Podiatry

## 2024-04-19 ENCOUNTER — Ambulatory Visit: Admitting: Podiatry

## 2024-05-18 ENCOUNTER — Ambulatory Visit: Admitting: Podiatry

## 2024-05-31 ENCOUNTER — Ambulatory Visit: Admitting: Podiatry

## 2024-06-14 ENCOUNTER — Ambulatory Visit (INDEPENDENT_AMBULATORY_CARE_PROVIDER_SITE_OTHER): Admitting: Podiatry

## 2024-06-14 ENCOUNTER — Encounter: Payer: Self-pay | Admitting: Podiatry

## 2024-06-14 DIAGNOSIS — M21612 Bunion of left foot: Secondary | ICD-10-CM

## 2024-06-14 DIAGNOSIS — M21611 Bunion of right foot: Secondary | ICD-10-CM

## 2024-06-14 DIAGNOSIS — L853 Xerosis cutis: Secondary | ICD-10-CM | POA: Diagnosis not present

## 2024-06-14 MED ORDER — AMMONIUM LACTATE 12 % EX CREA: 1.0000 | TOPICAL_CREAM | CUTANEOUS | 0 refills | Status: AC | PRN

## 2024-06-14 NOTE — Progress Notes (Signed)
  Subjective:  Patient ID: [name], female    DOB: 04/06/1966,  MRN: [id number]  Chief Complaint  Patient presents with   Nail Problem    Follow up for Bilateral nail fungus. Pt has notice  some  improvement but no 100%.   Still having some peeling on bottom of feet.  Non Diabetic no anti coag    58 y.o. female presents patient presents as follow up onychomycosis of the toenails.  She has been using Penlac  for close to a year.  Pretty good improvement overall.  Also previously had been dealing with tinea pedis.  Still has some dry flaking skin to the feet but this does not itch, does not have redness and appears fairly mild overall.  She is also asking about bunions and does have some concerns for worsening progression though they do not cause her any pain.  Past Medical History:  Diagnosis Date   Allergy    Anemia    History of palpitations couple of years ago   stress related  per pt   Seasonal allergies     Allergies  Allergen Reactions   Ginger Hives   Other Nausea And Vomiting    Darvocet-N   Estradiol Hives    ROS: Negative except as per HPI above  Objective:  General: AAO x3, NAD  Dermatological: Good improvement to the toenails with near complete resolution.  The great toenails in particular there is mild residual vertical yellow discoloration affecting less than 20% of the length and affecting less than 20% of the width of the affected toenails.  There are some peeling skin to the plantar surfaces of the feet bilaterally which is pretty mild and asymptomatic, without erythema.  Vascular:  Dorsalis Pedis artery and Posterior Tibial artery pedal pulses are 2/4 bilateral.  Capillary fill time < 3 sec to all digits.   Neruologic: Grossly intact via light touch bilateral. Protective threshold intact to all sites bilateral.   Musculoskeletal: Muscular strength 5/5 in all groups tested bilateral. Mild bunion deformities with slight valgus drift noted to first toes  bilaterally.  Gait: Unassisted, Nonantalgic.   No images are attached to the encounter.  Assessment:   1. Xerosis cutis   2. Bilateral bunions      Plan:  Patient was evaluated and treated and all questions answered.  Onychomycosis appears resolved at this point.  Can discontinue the use of the Penlac  nail lacquer.  She can try some urea-based nail gel for the residual nail discoloration but did discuss that this may be due to recurrent microtrauma over time.  Regarding the dry pedal skin, recommend use of 12% ammonium lactate  cream or lotion applied 1-2 times a day as needed.  Prescription sent to patient's pharmacy.  Did briefly discuss conservative management of bunions at this time.  They are asymptomatic overall.  Recommend use of good-quality over-the-counter inserts such as power steps or Superfeet which may help to slow the progression of the deformity.   Return if symptoms worsen or fail to improve, for bunions, xerosis cutis.    Ethan Saddler, DPM [facility] / [facility]

## 2024-07-15 ENCOUNTER — Other Ambulatory Visit: Payer: Self-pay | Admitting: Obstetrics

## 2024-07-15 ENCOUNTER — Other Ambulatory Visit: Payer: Self-pay | Admitting: Obstetrics and Gynecology

## 2024-07-15 DIAGNOSIS — R928 Other abnormal and inconclusive findings on diagnostic imaging of breast: Secondary | ICD-10-CM

## 2024-07-19 ENCOUNTER — Other Ambulatory Visit: Payer: Self-pay | Admitting: Obstetrics and Gynecology

## 2024-07-19 ENCOUNTER — Ambulatory Visit
Admission: RE | Admit: 2024-07-19 | Discharge: 2024-07-19 | Disposition: A | Discharge status: Home / Self Care | Source: Ambulatory Visit | Attending: Obstetrics and Gynecology | Admitting: Obstetrics and Gynecology

## 2024-07-19 DIAGNOSIS — R928 Other abnormal and inconclusive findings on diagnostic imaging of breast: Secondary | ICD-10-CM

## 2024-09-20 ENCOUNTER — Other Ambulatory Visit: Payer: Self-pay | Admitting: Endocrinology

## 2024-09-20 DIAGNOSIS — E049 Nontoxic goiter, unspecified: Secondary | ICD-10-CM
